# Patient Record
Sex: Female | Born: 1935 | Race: Black or African American | Hispanic: No | Smoking: Former smoker
Health system: Southern US, Community
[De-identification: ages and names within clinical notes are randomized; demographics above are authoritative.]

## PROBLEM LIST (undated history)

## (undated) DIAGNOSIS — R7303 Prediabetes: Secondary | ICD-10-CM

## (undated) DIAGNOSIS — I10 Essential (primary) hypertension: Secondary | ICD-10-CM

## (undated) HISTORY — PX: TUBAL LIGATION: SHX77

## (undated) HISTORY — PX: KNEE SURGERY: SHX244

---

## 2004-07-16 ENCOUNTER — Ambulatory Visit: Payer: Self-pay | Admitting: Internal Medicine

## 2005-10-05 ENCOUNTER — Ambulatory Visit: Payer: Self-pay | Admitting: Internal Medicine

## 2006-10-20 ENCOUNTER — Ambulatory Visit: Payer: Self-pay | Admitting: Internal Medicine

## 2007-10-24 ENCOUNTER — Ambulatory Visit: Payer: Self-pay | Admitting: Internal Medicine

## 2008-11-14 ENCOUNTER — Ambulatory Visit: Payer: Self-pay | Admitting: Internal Medicine

## 2009-11-18 ENCOUNTER — Ambulatory Visit: Payer: Self-pay | Admitting: Internal Medicine

## 2010-12-03 ENCOUNTER — Ambulatory Visit: Payer: Self-pay | Admitting: Internal Medicine

## 2012-01-13 ENCOUNTER — Ambulatory Visit: Payer: Self-pay

## 2012-02-09 IMAGING — MG MM CAD SCREENING MAMMO
1 series · 4 of 4 positions shown · non-contrast
Comparison: 11/18/2009 and 11/14/2008.

REASON FOR EXAM: SCR MAMMO
COMMENTS:

PROCEDURE:     MAM - MAM DGTL SCREENING MAMMO W/CAD  - December 03, 2010 [DATE]
RESULT:

[Series 6713: R CC · right · 4 of 4 slices shown]
[im 1/4]
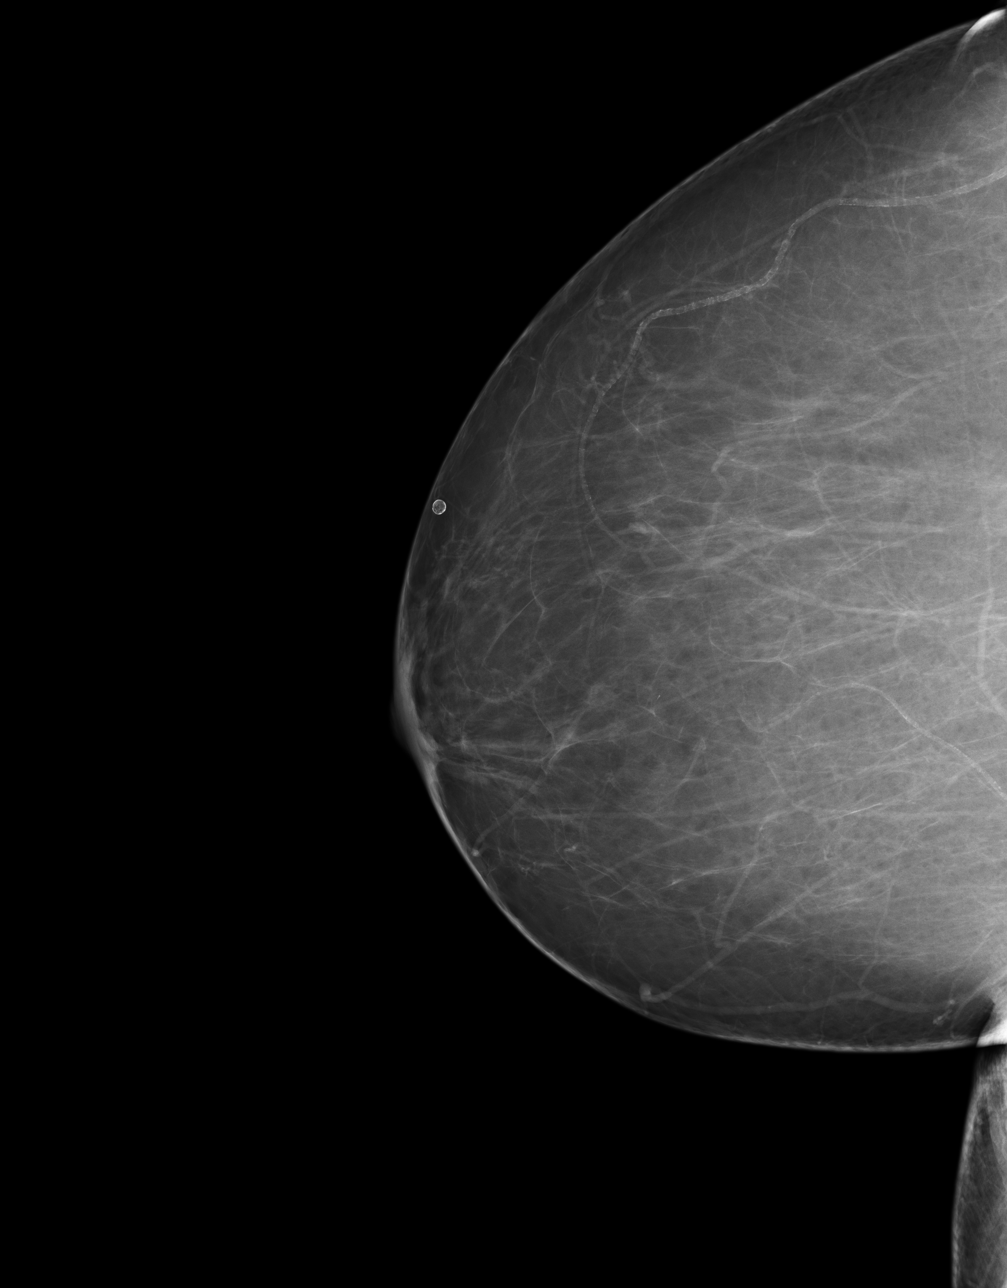
[im 2/4]
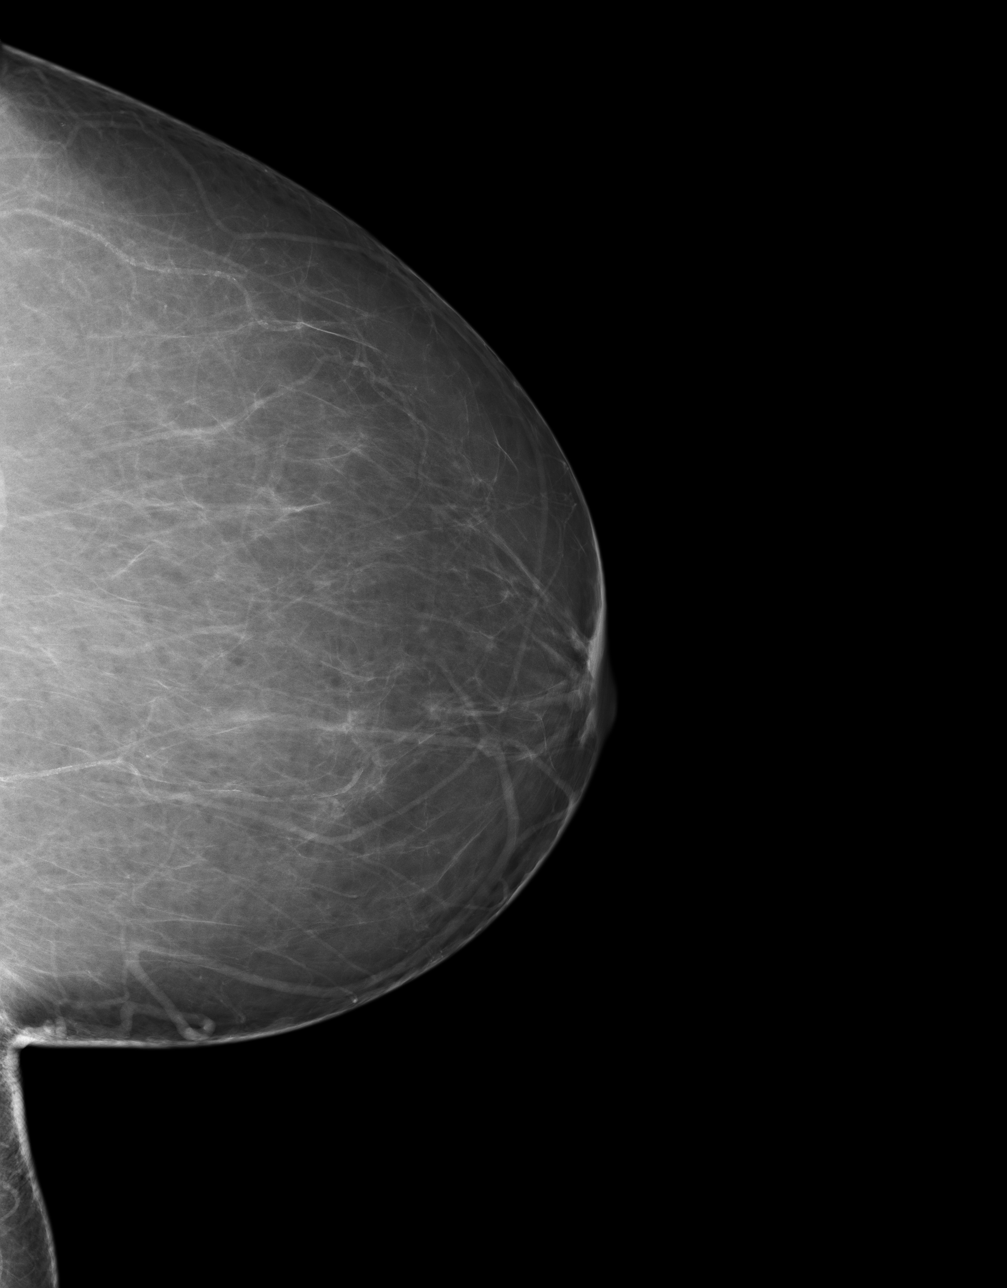
[im 3/4]
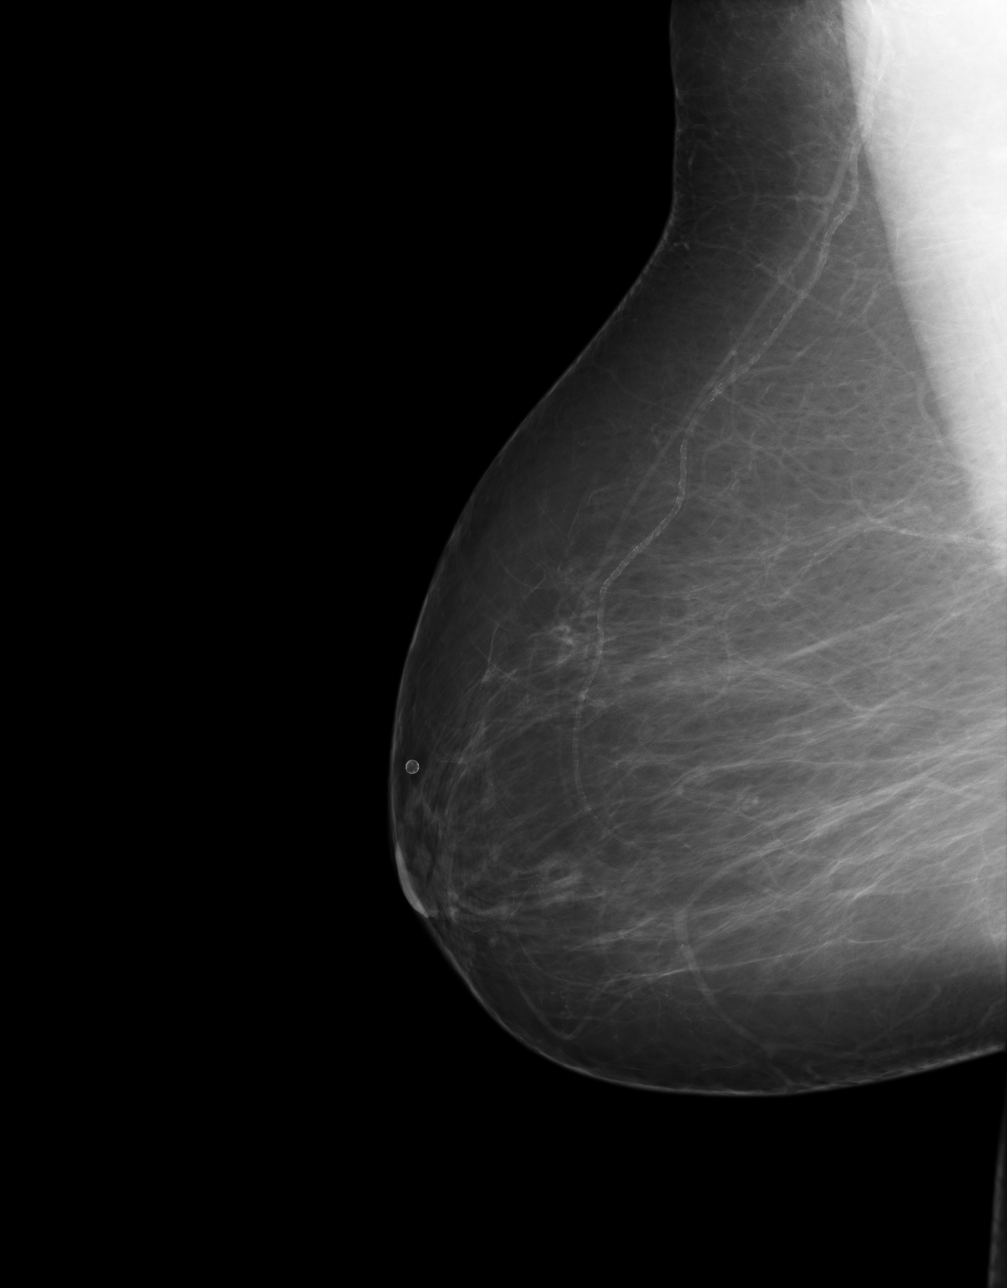
[im 4/4]
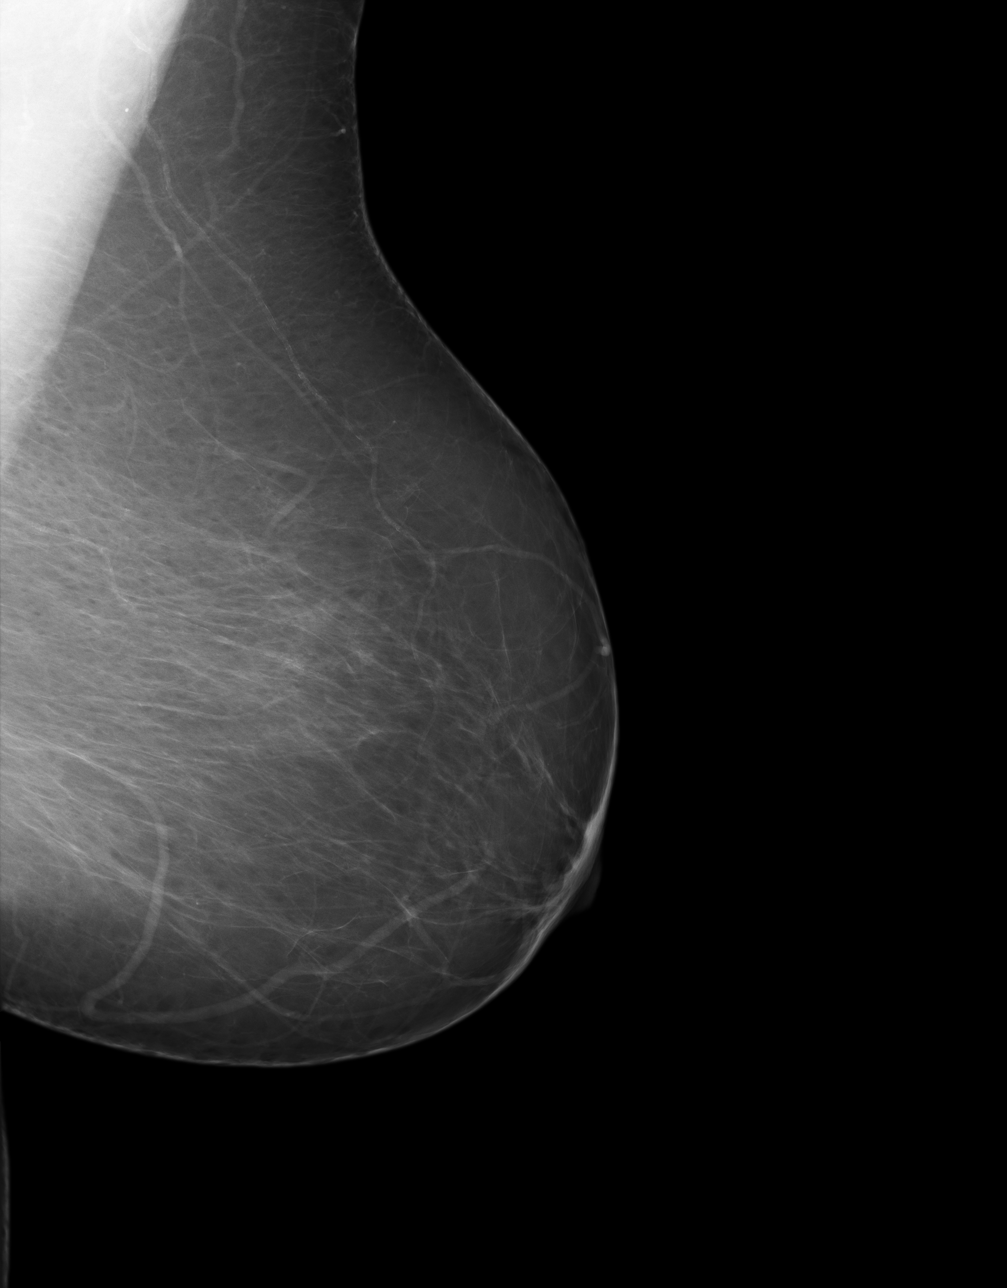

[4 of 4 positions shown; findings below may reference images not displayed]

FINDINGS: The breast tissue is predominantly fatty. No suspicious masses or
calcifications are identified. No areas of architectural distortion.
IMPRESSION: BI-RADS: Category 1 - Negative

RECOMMENDATION:  Continued annual screening mammography.

Thank you for this opportunity to contribute to the care of your patient.

A NEGATIVE MAMMOGRAM REPORT DOES NOT PRECLUDE BIOPSY OR OTHER EVALUATION OF
A CLINICALLY PALPABLE OR OTHERWISE SUSPICIOUS MASS OR LESION. BREAST CANCER
MAY NOT BE DETECTED BY MAMMOGRAPHY IN UP TO 10% OF CASES.

## 2013-01-15 ENCOUNTER — Ambulatory Visit: Payer: Self-pay

## 2014-02-26 ENCOUNTER — Ambulatory Visit: Payer: Self-pay | Admitting: Internal Medicine

## 2014-10-24 ENCOUNTER — Other Ambulatory Visit: Payer: Self-pay | Admitting: Internal Medicine

## 2014-10-24 DIAGNOSIS — Z1239 Encounter for other screening for malignant neoplasm of breast: Secondary | ICD-10-CM

## 2015-03-05 ENCOUNTER — Ambulatory Visit: Payer: Self-pay | Attending: Internal Medicine

## 2015-04-23 DIAGNOSIS — M1711 Unilateral primary osteoarthritis, right knee: Secondary | ICD-10-CM | POA: Insufficient documentation

## 2015-04-23 DIAGNOSIS — I10 Essential (primary) hypertension: Secondary | ICD-10-CM | POA: Insufficient documentation

## 2015-04-23 DIAGNOSIS — E119 Type 2 diabetes mellitus without complications: Secondary | ICD-10-CM | POA: Insufficient documentation

## 2015-04-23 DIAGNOSIS — J302 Other seasonal allergic rhinitis: Secondary | ICD-10-CM | POA: Insufficient documentation

## 2015-05-06 ENCOUNTER — Emergency Department (HOSPITAL_COMMUNITY): Admission: EM | Admit: 2015-05-06 | Discharge: 2015-05-06 | Discharge status: Absent without leave | Payer: Self-pay

## 2016-07-08 DIAGNOSIS — G8929 Other chronic pain: Secondary | ICD-10-CM | POA: Insufficient documentation

## 2016-09-21 DIAGNOSIS — E669 Obesity, unspecified: Secondary | ICD-10-CM | POA: Insufficient documentation

## 2016-09-22 ENCOUNTER — Other Ambulatory Visit: Payer: Self-pay | Admitting: Unknown Physician Specialty

## 2016-09-22 DIAGNOSIS — M1711 Unilateral primary osteoarthritis, right knee: Secondary | ICD-10-CM

## 2016-09-29 ENCOUNTER — Ambulatory Visit
Admission: RE | Admit: 2016-09-29 | Discharge: 2016-09-29 | Disposition: A | Discharge status: Home / Self Care | Payer: Medicare Other | Source: Ambulatory Visit | Attending: Unknown Physician Specialty | Admitting: Unknown Physician Specialty

## 2016-09-29 DIAGNOSIS — X58XXXA Exposure to other specified factors, initial encounter: Secondary | ICD-10-CM | POA: Insufficient documentation

## 2016-09-29 DIAGNOSIS — M25461 Effusion, right knee: Secondary | ICD-10-CM | POA: Insufficient documentation

## 2016-09-29 DIAGNOSIS — M1711 Unilateral primary osteoarthritis, right knee: Secondary | ICD-10-CM | POA: Insufficient documentation

## 2016-09-29 DIAGNOSIS — S83281A Other tear of lateral meniscus, current injury, right knee, initial encounter: Secondary | ICD-10-CM | POA: Insufficient documentation

## 2017-08-19 DIAGNOSIS — I10 Essential (primary) hypertension: Secondary | ICD-10-CM | POA: Insufficient documentation

## 2017-11-22 ENCOUNTER — Other Ambulatory Visit: Payer: Self-pay | Admitting: Internal Medicine

## 2017-11-22 DIAGNOSIS — Z1231 Encounter for screening mammogram for malignant neoplasm of breast: Secondary | ICD-10-CM

## 2017-11-22 DIAGNOSIS — M8589 Other specified disorders of bone density and structure, multiple sites: Secondary | ICD-10-CM | POA: Insufficient documentation

## 2018-02-22 ENCOUNTER — Other Ambulatory Visit: Payer: Self-pay | Admitting: Internal Medicine

## 2018-02-22 DIAGNOSIS — M7989 Other specified soft tissue disorders: Principal | ICD-10-CM

## 2018-02-22 DIAGNOSIS — M79662 Pain in left lower leg: Secondary | ICD-10-CM

## 2018-02-23 ENCOUNTER — Ambulatory Visit
Admission: RE | Admit: 2018-02-23 | Discharge: 2018-02-23 | Disposition: A | Discharge status: Home / Self Care | Payer: Medicare Other | Source: Ambulatory Visit | Attending: Internal Medicine | Admitting: Internal Medicine

## 2018-02-23 DIAGNOSIS — M7989 Other specified soft tissue disorders: Secondary | ICD-10-CM | POA: Insufficient documentation

## 2018-02-23 DIAGNOSIS — M79662 Pain in left lower leg: Secondary | ICD-10-CM | POA: Diagnosis not present

## 2018-07-20 DIAGNOSIS — Z0000 Encounter for general adult medical examination without abnormal findings: Secondary | ICD-10-CM | POA: Insufficient documentation

## 2018-09-12 DIAGNOSIS — Z66 Do not resuscitate: Secondary | ICD-10-CM | POA: Insufficient documentation

## 2019-03-07 DIAGNOSIS — M25661 Stiffness of right knee, not elsewhere classified: Secondary | ICD-10-CM | POA: Insufficient documentation

## 2020-04-20 IMAGING — US US EXTREM LOW VENOUS*L*
1 series · 13 of 24 positions shown · non-contrast
Comparison: None.

CLINICAL DATA: 87-year-old female



[Series 1: us extrem low venous*left* · 0.08mm/px · 13 of 46 slices shown]
[im 1/46]
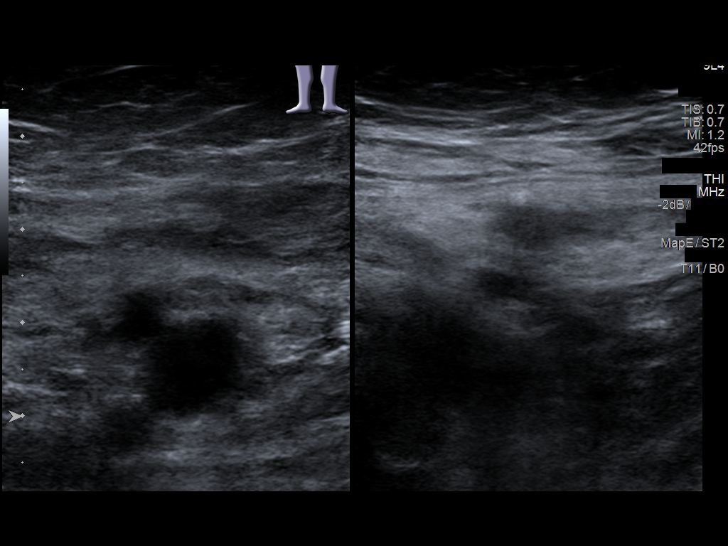
[im 4/46]
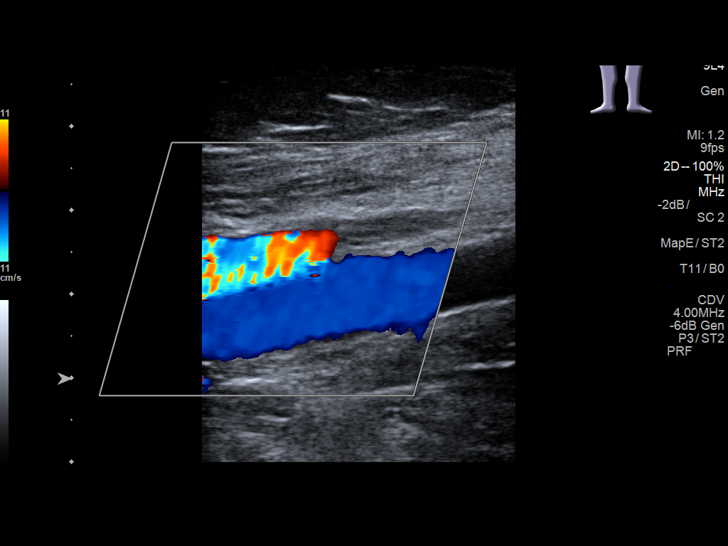
[im 8/46]
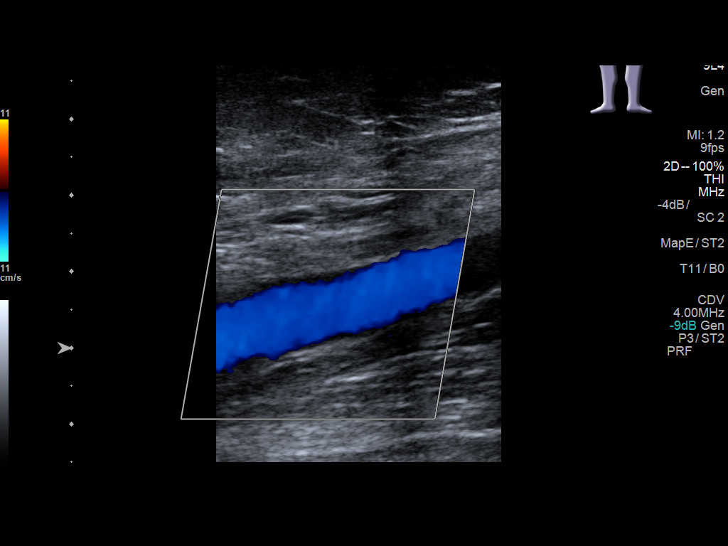
[im 12/46]
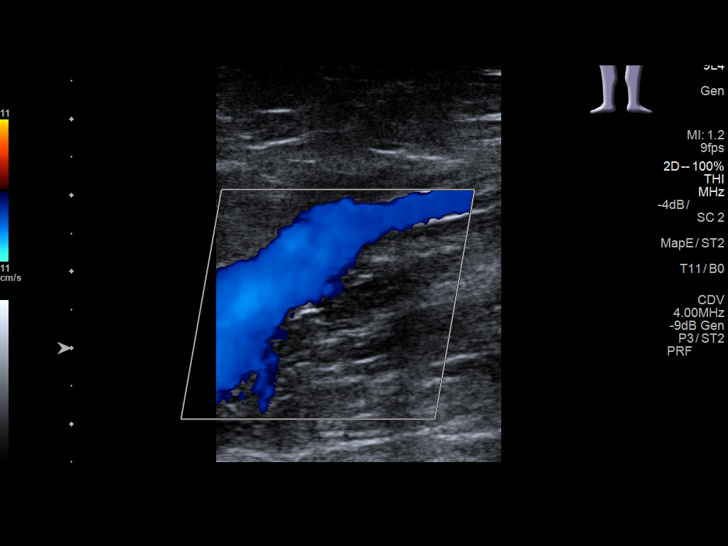
[im 16/46]
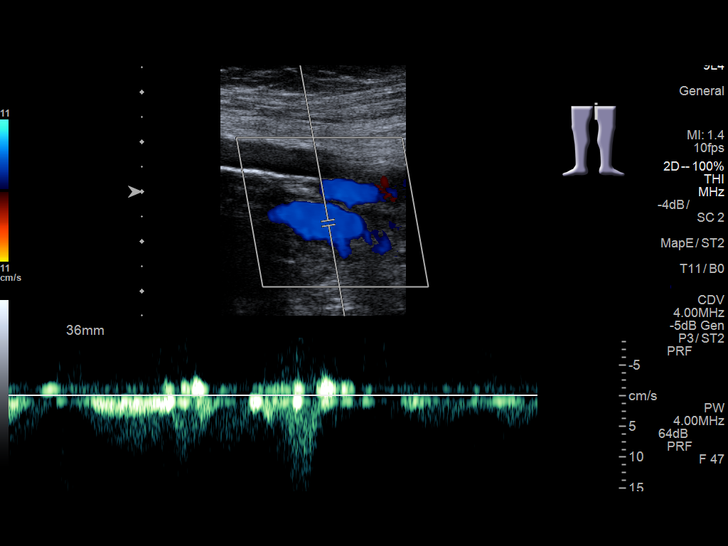
[im 20/46]
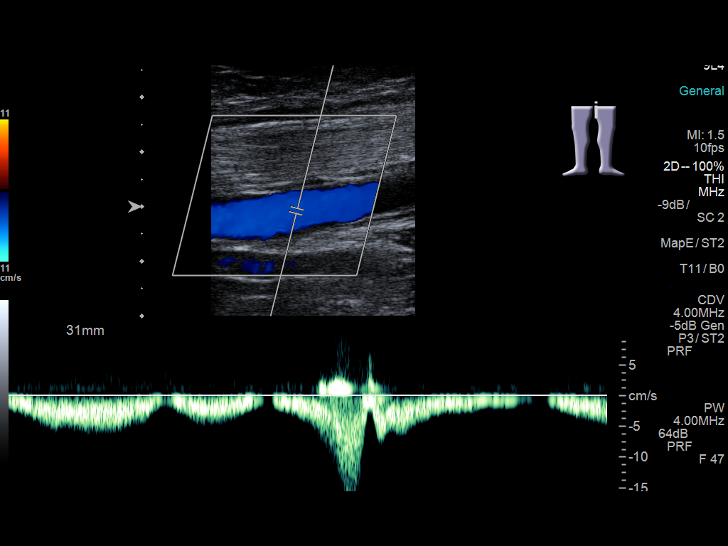
[im 24/46]
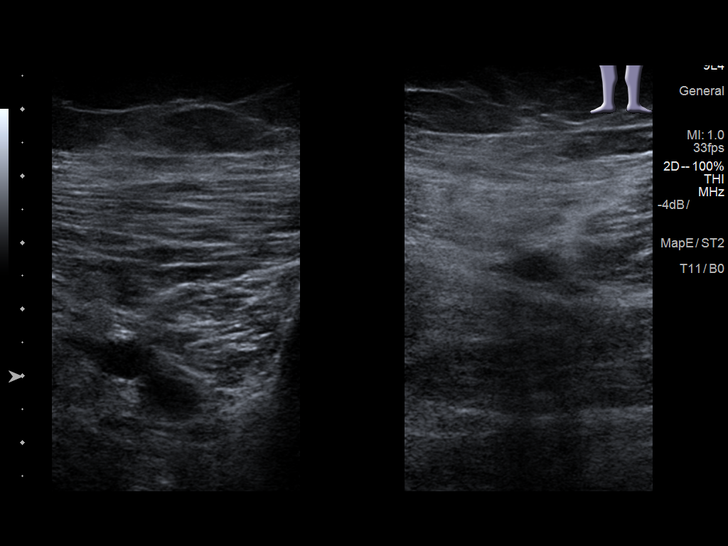
[im 26/46]
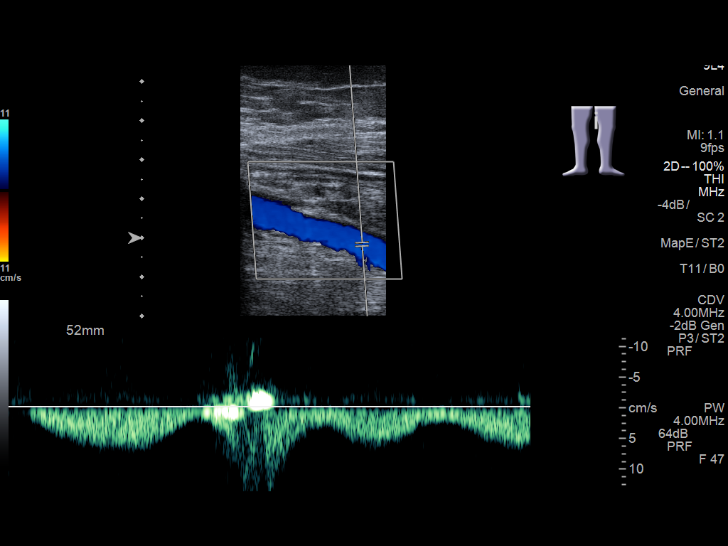
[im 30/46]
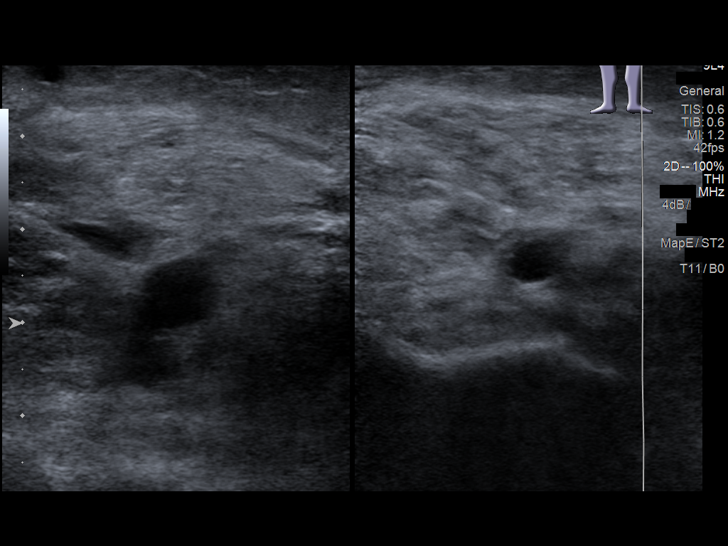
[im 34/46]
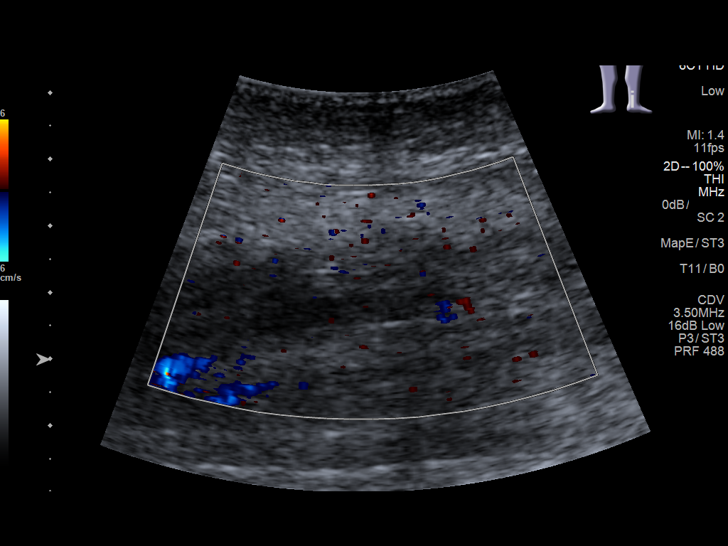
[im 38/46]
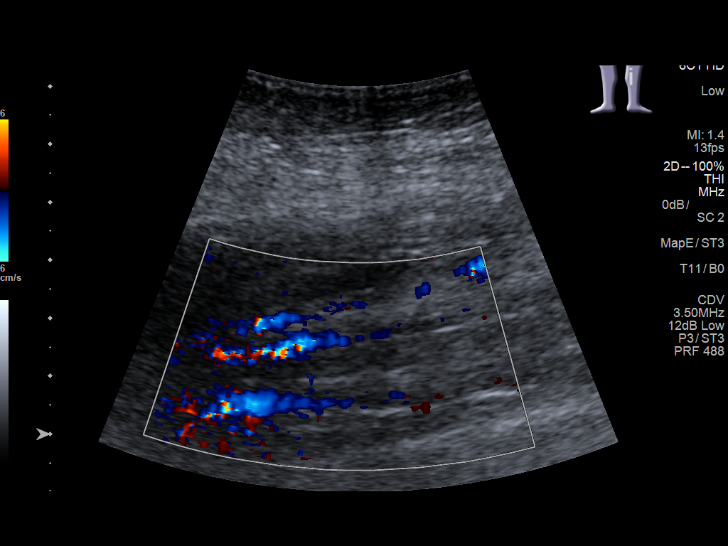
[im 42/46]
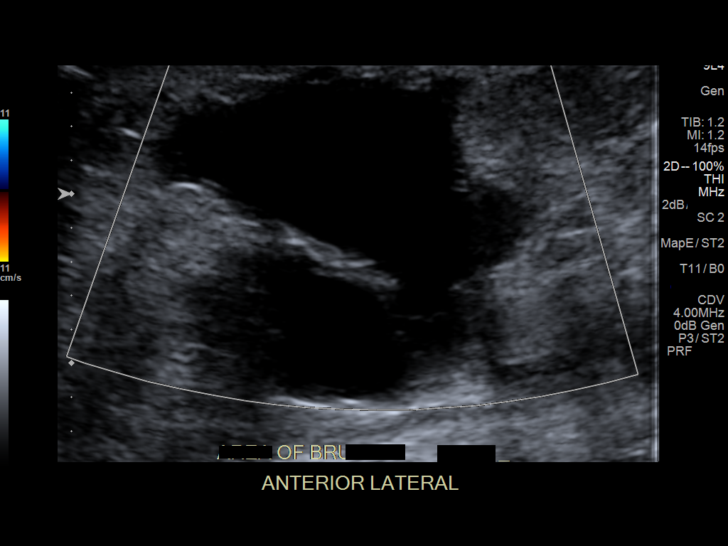
[im 46/46]
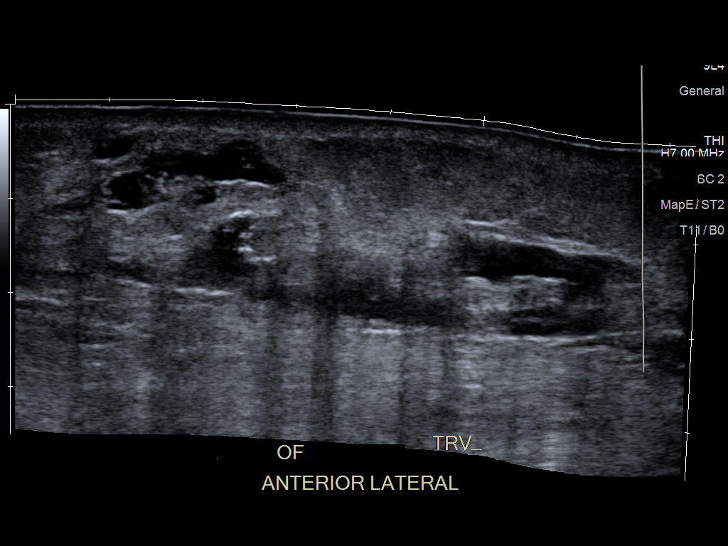

[13 of 24 positions shown; findings below may reference images not displayed]

FINDINGS: Contralateral Common Femoral Vein: Respiratory phasicity is normal
and symmetric with the symptomatic side. No evidence of thrombus.
Normal compressibility.

Common Femoral Vein: No evidence of thrombus. Normal
compressibility, respiratory phasicity and response to augmentation.

Saphenofemoral Junction: No evidence of thrombus. Normal
compressibility and flow on color Doppler imaging.

Profunda Femoral Vein: No evidence of thrombus. Normal
compressibility and flow on color Doppler imaging.

Femoral Vein: No evidence of thrombus. Normal compressibility,
respiratory phasicity and response to augmentation.

Popliteal Vein: No evidence of thrombus. Normal compressibility,
respiratory phasicity and response to augmentation.

Calf Veins: No evidence of thrombus. Normal compressibility and flow
on color Doppler imaging.

Superficial Great Saphenous Vein: No evidence of thrombus. Normal
compressibility.

Venous Reflux:  None.

Other Findings: Hypoechoic to anechoic complex fluid collection in
the superficial subcutaneous tissues deep to the region of bruising.
IMPRESSION: 1. Negative for acute DVT in the left lower extremity.
2. Heterogeneous complex fluid collections in the superficial
subcutaneous soft tissues underlying the region of bruising. If the
patient has a history of recent trauma, the abnormality likely
represents hematoma. In the appropriate clinical setting, abscess
could appear similar.

## 2021-02-12 ENCOUNTER — Encounter: Payer: Self-pay | Admitting: Emergency Medicine

## 2021-02-12 ENCOUNTER — Emergency Department: Payer: Medicare Other

## 2021-02-12 ENCOUNTER — Inpatient Hospital Stay
Admission: EM | Admit: 2021-02-12 | Discharge: 2021-02-14 | DRG: 229 | Disposition: A | Discharge status: Home / Self Care | Payer: Medicare Other | Attending: Hospitalist | Admitting: Hospitalist

## 2021-02-12 ENCOUNTER — Other Ambulatory Visit: Payer: Self-pay

## 2021-02-12 DIAGNOSIS — I495 Sick sinus syndrome: Principal | ICD-10-CM | POA: Diagnosis present

## 2021-02-12 DIAGNOSIS — R001 Bradycardia, unspecified: Secondary | ICD-10-CM | POA: Diagnosis not present

## 2021-02-12 DIAGNOSIS — Z20822 Contact with and (suspected) exposure to covid-19: Secondary | ICD-10-CM | POA: Diagnosis present

## 2021-02-12 DIAGNOSIS — R0602 Shortness of breath: Secondary | ICD-10-CM

## 2021-02-12 DIAGNOSIS — I442 Atrioventricular block, complete: Secondary | ICD-10-CM | POA: Diagnosis not present

## 2021-02-12 DIAGNOSIS — E538 Deficiency of other specified B group vitamins: Secondary | ICD-10-CM | POA: Diagnosis not present

## 2021-02-12 DIAGNOSIS — I10 Essential (primary) hypertension: Secondary | ICD-10-CM | POA: Diagnosis not present

## 2021-02-12 DIAGNOSIS — R7303 Prediabetes: Secondary | ICD-10-CM | POA: Diagnosis not present

## 2021-02-12 DIAGNOSIS — I110 Hypertensive heart disease with heart failure: Secondary | ICD-10-CM | POA: Diagnosis present

## 2021-02-12 DIAGNOSIS — Z79899 Other long term (current) drug therapy: Secondary | ICD-10-CM

## 2021-02-12 DIAGNOSIS — Z6838 Body mass index (BMI) 38.0-38.9, adult: Secondary | ICD-10-CM

## 2021-02-12 DIAGNOSIS — R600 Localized edema: Secondary | ICD-10-CM

## 2021-02-12 DIAGNOSIS — Z87891 Personal history of nicotine dependence: Secondary | ICD-10-CM

## 2021-02-12 DIAGNOSIS — Z7982 Long term (current) use of aspirin: Secondary | ICD-10-CM

## 2021-02-12 DIAGNOSIS — Z8249 Family history of ischemic heart disease and other diseases of the circulatory system: Secondary | ICD-10-CM

## 2021-02-12 HISTORY — DX: Prediabetes: R73.03

## 2021-02-12 HISTORY — DX: Essential (primary) hypertension: I10

## 2021-02-12 LAB — BASIC METABOLIC PANEL
Anion gap: 5 (ref 5–15)
BUN: 26 mg/dL — ABNORMAL HIGH (ref 8–23)
CO2: 27 mmol/L (ref 22–32)
Calcium: 9.5 mg/dL (ref 8.9–10.3)
Chloride: 105 mmol/L (ref 98–111)
Creatinine, Ser: 0.64 mg/dL (ref 0.44–1.00)
GFR, Estimated: >60 mL/min (ref >60)
Glucose, Bld: 108 mg/dL — ABNORMAL HIGH (ref 70–99)
Potassium: 4.7 mmol/L (ref 3.5–5.1)
Sodium: 137 mmol/L (ref 135–145)

## 2021-02-12 LAB — TROPONIN I (HIGH SENSITIVITY)
Troponin I (High Sensitivity): 12 ng/L (ref <18)
Troponin I (High Sensitivity): 10 ng/L (ref <18)

## 2021-02-12 LAB — MAGNESIUM: Magnesium: 2.2 mg/dL (ref 1.7–2.4)

## 2021-02-12 LAB — CBC
HCT: 39.3 % (ref 36.0–46.0)
Hemoglobin: 13.2 g/dL (ref 12.0–15.0)
MCH: 32 pg (ref 26.0–34.0)
MCHC: 33.6 g/dL (ref 30.0–36.0)
MCV: 95.4 fL (ref 80.0–100.0)
Platelets: 210 10*3/uL (ref 150–400)
RBC: 4.12 MIL/uL (ref 3.87–5.11)
RDW: 15.3 % (ref 11.5–15.5)
WBC: 7.2 10*3/uL (ref 4.0–10.5)
nRBC: 0 % (ref 0.0–0.2)

## 2021-02-12 LAB — RESP PANEL BY RT-PCR (FLU A&B, COVID) ARPGX2
Influenza A by PCR: NEGATIVE
Influenza B by PCR: NEGATIVE
SARS Coronavirus 2 by RT PCR: NEGATIVE

## 2021-02-12 LAB — CBG MONITORING, ED: Glucose-Capillary: 119 mg/dL — ABNORMAL HIGH (ref 70–99)

## 2021-02-12 LAB — BRAIN NATRIURETIC PEPTIDE: B Natriuretic Peptide: 162.8 pg/mL — ABNORMAL HIGH (ref 0.0–100.0)

## 2021-02-12 LAB — TSH: TSH: 2.08 u[IU]/mL (ref 0.350–4.500)

## 2021-02-12 MED ORDER — SODIUM CHLORIDE 0.9 % IV SOLN: INTRAVENOUS | Status: DC

## 2021-02-12 MED ORDER — OCUVITE-LUTEIN PO CAPS
1.0000 | ORAL_CAPSULE | Freq: Every day | ORAL | Status: DC
  Administered 2021-02-13 – 2021-02-14 (×2): 1 via ORAL
  Filled 2021-02-12 (×2): qty 1

## 2021-02-12 MED ORDER — CYANOCOBALAMIN 500 MCG PO TABS
500.0000 ug | ORAL_TABLET | Freq: Every day | ORAL | Status: DC
  Administered 2021-02-13 – 2021-02-14 (×2): 500 ug via ORAL
  Filled 2021-02-12 (×2): qty 1

## 2021-02-12 MED ORDER — POLYVINYL ALCOHOL 1.4 % OP SOLN
1.0000 [drp] | Freq: Every day | OPHTHALMIC | Status: DC | PRN
  Filled 2021-02-12: qty 15

## 2021-02-12 MED ORDER — ASPIRIN 81 MG PO CHEW
81.0000 mg | CHEWABLE_TABLET | Freq: Every day | ORAL | Status: DC
  Administered 2021-02-12 – 2021-02-14 (×3): 81 mg via ORAL
  Filled 2021-02-12 (×3): qty 1

## 2021-02-12 MED ORDER — ONDANSETRON HCL 4 MG/2ML IJ SOLN: 4.0000 mg | Freq: Four times a day (QID) | INTRAMUSCULAR | Status: DC | PRN

## 2021-02-12 MED ORDER — ADULT MULTIVITAMIN W/MINERALS CH
1.0000 | ORAL_TABLET | Freq: Every day | ORAL | Status: DC
  Administered 2021-02-13 – 2021-02-14 (×2): 1 via ORAL
  Filled 2021-02-12 (×2): qty 1

## 2021-02-12 MED ORDER — TRAZODONE HCL 50 MG PO TABS: 25.0000 mg | ORAL_TABLET | Freq: Every evening | ORAL | Status: DC | PRN

## 2021-02-12 MED ORDER — ACETAMINOPHEN 650 MG RE SUPP
650.0000 mg | Freq: Four times a day (QID) | RECTAL | Status: DC | PRN
  Filled 2021-02-12: qty 1

## 2021-02-12 MED ORDER — ASCORBIC ACID 500 MG PO TABS
500.0000 mg | ORAL_TABLET | Freq: Every day | ORAL | Status: DC
  Administered 2021-02-13 – 2021-02-14 (×2): 500 mg via ORAL
  Filled 2021-02-12 (×2): qty 1

## 2021-02-12 MED ORDER — OYSTER SHELL CALCIUM/D3 500-5 MG-MCG PO TABS
1.0000 | ORAL_TABLET | Freq: Two times a day (BID) | ORAL | Status: DC
  Administered 2021-02-13 – 2021-02-14 (×3): 1 via ORAL
  Filled 2021-02-12 (×3): qty 1

## 2021-02-12 MED ORDER — CARBOXYMETHYLCELLULOSE SODIUM 1 % OP SOLN: 1.0000 [drp] | Freq: Every day | OPHTHALMIC | Status: DC | PRN

## 2021-02-12 MED ORDER — AMLODIPINE BESYLATE 5 MG PO TABS: 5.0000 mg | ORAL_TABLET | Freq: Every day | ORAL | Status: DC

## 2021-02-12 MED ORDER — ACETAMINOPHEN 325 MG PO TABS: 650.0000 mg | ORAL_TABLET | Freq: Four times a day (QID) | ORAL | Status: DC | PRN

## 2021-02-12 MED ORDER — INSULIN ASPART 100 UNIT/ML IJ SOLN
0.0000 [IU] | Freq: Three times a day (TID) | INTRAMUSCULAR | Status: DC
  Administered 2021-02-13: 1 [IU] via SUBCUTANEOUS
  Filled 2021-02-12: qty 1

## 2021-02-12 MED ORDER — TRIAMTERENE-HCTZ 37.5-25 MG PO TABS
1.0000 | ORAL_TABLET | Freq: Every day | ORAL | Status: DC
  Administered 2021-02-12 – 2021-02-14 (×3): 1 via ORAL
  Filled 2021-02-12 (×3): qty 1

## 2021-02-12 MED ORDER — ENOXAPARIN SODIUM 40 MG/0.4ML IJ SOSY: 40.0000 mg | PREFILLED_SYRINGE | INTRAMUSCULAR | Status: DC

## 2021-02-12 MED ORDER — HYDRALAZINE HCL 20 MG/ML IJ SOLN: 10.0000 mg | Freq: Four times a day (QID) | INTRAMUSCULAR | Status: DC | PRN

## 2021-02-12 MED ORDER — MAGNESIUM HYDROXIDE 400 MG/5ML PO SUSP
30.0000 mL | Freq: Every day | ORAL | Status: DC | PRN
  Administered 2021-02-14: 30 mL via ORAL
  Filled 2021-02-12: qty 30

## 2021-02-12 MED ORDER — LORATADINE 10 MG PO TABS
10.0000 mg | ORAL_TABLET | Freq: Every day | ORAL | Status: DC
  Administered 2021-02-13 – 2021-02-14 (×2): 10 mg via ORAL
  Filled 2021-02-12 (×2): qty 1

## 2021-02-12 MED ORDER — ONDANSETRON HCL 4 MG PO TABS: 4.0000 mg | ORAL_TABLET | Freq: Four times a day (QID) | ORAL | Status: DC | PRN

## 2021-02-12 MED ORDER — LISINOPRIL 20 MG PO TABS
20.0000 mg | ORAL_TABLET | Freq: Every day | ORAL | Status: DC
  Administered 2021-02-12 – 2021-02-14 (×3): 20 mg via ORAL
  Filled 2021-02-12: qty 1
  Filled 2021-02-12 (×2): qty 2

## 2021-02-12 MED ORDER — ENOXAPARIN SODIUM 60 MG/0.6ML IJ SOSY
0.5000 mg/kg | PREFILLED_SYRINGE | INTRAMUSCULAR | Status: DC
  Administered 2021-02-13: 47.5 mg via SUBCUTANEOUS
  Filled 2021-02-12 (×2): qty 0.47

## 2021-02-12 NOTE — ED Notes (Signed)
Meal tray at bedside.  

## 2021-02-12 NOTE — ED Notes (Signed)
Bradler, MD at bedside. 

## 2021-02-12 NOTE — Consult Note (Signed)
Cardiology Consultation:   Patient ID: Monica Harper MRN: [id number]; DOB: January 09, 1931  Admit date: 02/12/2021 Date of Consult: 02/12/2021  PCP:  [facility]   [facility] Providers Cardiologist:   [place]   Patient Profile:   Monica Harper is a 87 y.o. female with a hx of HTN, borderline DM2, Morbid obesity who is being seen 02/12/2021 for the evaluation of bradycardia and dizziness at the request of Dr. Vicente Males.  History of Present Illness:   Monica Harper has not been seen by cardiology in the past. H/o of HTN on lisinopril, amlodipine, and Dyazide. He has diet controlled diabetes. No prior h/o MI, cardiac stenting. CHF, or stroke. She lives with her son and can take care of herself OK. She ambulates well without a cane or walking. There is question as to whether she takes atenolol a home. According to chart review in care everywhere she has not been taking it.   The patient presented to [facility] ED for bradycardia and dizziness from [facility] clinic. She reports 1 week of dizziness, worse when she stood up. Also noted heart rates in the 40s. Noted worsening SOB and LLE as well. No chest pain, nasuea, vomiting, fever, chills, orthopnea, pnd.   EKG showed complete heart block with heart rate of 42bpm. BP 167/67, RR 18, afebrile. Labs show sodium 137, potassium 4.7, Scr 0.64, BUN 108, WBC 7.2, Hgb 13.2. BNP 162. HS trop 12.  CXR unremarkable.    Past Medical History:  Diagnosis Date   Borderline diabetes    Hypertension     Past Surgical History:  Procedure Laterality Date   KNEE SURGERY     TUBAL LIGATION       Home Medications:  Prior to Admission medications   Medication Sig Start Date End Date Taking? Authorizing Provider  amLODipine (NORVASC) 5 MG tablet Take 5 mg by mouth daily. 06/19/19  Yes [provider]  ascorbic acid (VITAMIN C) 500 MG tablet Take 500 mg by mouth daily.   Yes [provider]  aspirin 81 MG chewable tablet Chew 81 mg by mouth daily.  01/22/19  Yes [provider]  atenolol (TENORMIN) 25 MG tablet Take 25 mg by mouth daily. 10/18/17  Yes [provider]  Calcium Carb-Cholecalciferol 600-10 MG-MCG TABS Take 1 tablet by mouth 2 (two) times daily before a meal.   Yes [provider]  Cinnamon Bark POWD 1 packet by Does not apply route daily.   Yes [provider]  fexofenadine (ALLEGRA) 180 MG tablet Take 180 mg by mouth daily.   Yes [provider]  lisinopril (ZESTRIL) 20 MG tablet Take 20 mg by mouth daily. 11/21/20  Yes [provider]  Multiple Vitamin (MULTI-VITAMIN) tablet Take 1 tablet by mouth daily.   Yes [provider]  multivitamin-lutein (OCUVITE-LUTEIN) CAPS capsule Take 1 capsule by mouth daily.   Yes [provider]  triamterene-hydrochlorothiazide (DYAZIDE) 37.5-25 MG capsule Take 1 capsule by mouth daily. 12/05/17  Yes [provider]  vitamin B-12 (CYANOCOBALAMIN) 500 MCG tablet Take 500 mcg by mouth daily.   Yes [provider]  carboxymethylcellulose 1 % ophthalmic solution Apply 1 drop to eye daily as needed.    [provider]    Inpatient Medications: Scheduled Meds:  Continuous Infusions:  PRN Meds:   Allergies:    Allergies  Allergen Reactions   Amoxicillin Nausea And Vomiting    Social History:   Social History   Socioeconomic History   Marital status: Widowed  Spouse name: Not on file   Number of children: Not on file   Years of education: Not on file   Highest education level: Not on file  Occupational History   Not on file  Tobacco Use   Smoking status: Former    Types: Cigarettes   Smokeless tobacco: Never  Substance and Sexual Activity   Alcohol use: Not Currently   Drug use: Not on file   Sexual activity: Not on file  Other Topics Concern   Not on file  Social History Narrative   Not on file   Social Determinants of Health   Financial Resource Strain: Not on file   Food Insecurity: Not on file  Transportation Needs: Not on file  Physical Activity: Not on file  Stress: Not on file  Social Connections: Not on file  Intimate Partner Violence: Not on file    Family History:   History reviewed. No pertinent family history.   ROS:  Please see the history of present illness.   All other ROS reviewed and negative.     Physical Exam/Data:   Vitals:   02/12/21 1225 02/12/21 1300 02/12/21 1330 02/12/21 1400  BP: (!) 167/67 (!) 177/46 (!) 154/49 (!) 157/45  Pulse: (!) 40 (!) 41 (!) 40 (!) 37  Resp: 18 (!) 23 (!) 22 16  Temp: 98.4 F (36.9 C)     TempSrc: Oral     SpO2: 95% 95% 98% 97%  Weight:      Height:       No intake or output data in the 24 hours ending 02/12/21 1403 Last 3 Weights 02/12/2021  Weight (lbs) 211 lb  Weight (kg) 95.709 kg     Body mass index is 38.59 kg/m.  General:  Well nourished, well developed, in no acute distress HEENT: normal Neck: no JVD Vascular: No carotid bruits; Distal pulses 2+ bilaterally Cardiac:  normal S1, S2; bradycardia, RR; no murmur  Lungs:  clear to auscultation bilaterally, no wheezing, rhonchi or rales  Abd: soft, nontender, no hepatomegaly  Ext: mild lower leg edema Musculoskeletal:  No deformities, BUE and BLE strength normal and equal Skin: warm and dry  Neuro:  CNs 2-12 intact, no focal abnormalities noted Psych:  Normal affect   EKG:  The EKG was personally reviewed and demonstrates:  CHB, 41bpm, RAD, nonspecific T wave changes Telemetry:  Telemetry was personally reviewed and demonstrates:  CHB, HR 40s  Relevant CV Studies:   Laboratory Data:  High Sensitivity Troponin:   Recent Labs  Lab 02/12/21 1224  TROPONINIHS 12     Chemistry Recent Labs  Lab 02/12/21 1224  NA 137  K 4.7  CL 105  CO2 27  GLUCOSE 108*  BUN 26*  CREATININE 0.64  CALCIUM 9.5  GFRNONAA >60  ANIONGAP 5    No results for input(s): PROT, ALBUMIN, AST, ALT, ALKPHOS, BILITOT in the last 168  hours. Lipids No results for input(s): CHOL, TRIG, HDL, LABVLDL, LDLCALC, CHOLHDL in the last 168 hours.  Hematology Recent Labs  Lab 02/12/21 1224  WBC 7.2  RBC 4.12  HGB 13.2  HCT 39.3  MCV 95.4  MCH 32.0  MCHC 33.6  RDW 15.3  PLT 210   Thyroid No results for input(s): TSH, FREET4 in the last 168 hours.  BNPNo results for input(s): BNP, PROBNP in the last 168 hours.  DDimer No results for input(s): DDIMER in the last 168 hours.   Radiology/Studies:  [facility] Chest Port 1 View  Result Date:  02/12/2021 CLINICAL DATA:  Shortness of breath and bradycardia. EXAM: PORTABLE CHEST 1 VIEW COMPARISON:  Chest x-ray report dated March 03, 2020. FINDINGS: Normal cardiomediastinal silhouette, accentuated by technique. Normal pulmonary vascularity. Minimal scarring at the lung bases. No focal consolidation, pleural effusion, or pneumothorax. No acute osseous abnormality. IMPRESSION: 1. No active disease. Electronically Signed   By: Obie Dredge M.D.   On: 02/12/2021 13:21     Assessment and Plan:   Complete heart block - presents with dizziness and bradycardia found to be in CHB with hear rate of 42bpm - Does not appear to have been on rate lowering medication as OP - Labs show mildly elevated BNP, normal troponin, otherwise wnl - check Mag and TSH - check echo - Still in CHB, she will need a pacemaker. Will discuss with MD  HTN - BP high - restart home meds  For questions or updates, please contact [facility] Please consult [email or web address] for contact info under    Signed, Chibuike Fleek David Stall, PA-C  02/12/2021 2:03 PM

## 2021-02-12 NOTE — ED Notes (Signed)
 of urine emptied from urine canister.

## 2021-02-12 NOTE — H&P (Addendum)
[place]   PATIENT NAME: Monica Harper    MR#:  [id number]  DATE OF BIRTH:  [birth year]  DATE OF ADMISSION:  02/12/2021  PRIMARY CARE PHYSICIAN: [facility]   Patient is coming from: Home  REQUESTING/REFERRING PHYSICIAN: Phineas Semen, MD CHIEF COMPLAINT:   Chief Complaint  Patient presents with   Bradycardia    HISTORY OF PRESENT ILLNESS:  Monica Harper is a 87 y.o. African-American female with medical history significant for borderline type 2 diabetes mellitus and hypertension, presented to the ER with concern of lightheadedness with associated bradycardia with palpitations for the last week.  Has been worsening with exertion and partially relieved at rest.  No fever or chills.  No chest pain or nausea or vomiting or diaphoresis.  No cough or wheezing or hemoptysis.  No paresthesias or focal muscle weakness.  No dyspnea orthopnea or paroxysmal nocturnal dyspnea or worsening lower extremity edema.  No dysuria, oliguria or hematuria or flank pain.  ED Course: Upon presentation to the ER blood pressure 167/67 with a heart rate of 40 and otherwise normal vital signs.  Labs revealed a BUN of 26 and BNP of 162.8 with otherwise unremarkable BMP and CBC.  High-sensitivity troponin I was 12 and later 10.  Magnesium was 2.2.  Potassium was 4.7.  TSH was 2.08.  Influenza antigens and COVID-19 PCR came back negative. EKG as reviewed by me : EKG showed sinus rhythm with complete heart block and junctional bradycardia with right superior axis deviation and Q waves anteroseptally. Imaging: Chest x-ray showed minimal scarring at the lung bases with no focal consolidation or pleural effusions or osseous abnormalities.  Cardiology consultation was obtained by Dr. Mariah Milling who recommended stopping beta-blocker and 2D echo and will follow with [place].  She was placed on pacer pads.  She later converted to normal sinus rhythm.  The patient will be admitted to an observation cardiac telemetry bed  for further evaluation and management. PAST MEDICAL HISTORY:   Past Medical History:  Diagnosis Date   Borderline diabetes    Hypertension     PAST SURGICAL HISTORY:   Past Surgical History:  Procedure Laterality Date   KNEE SURGERY     TUBAL LIGATION      SOCIAL HISTORY:   Social History   Tobacco Use   Smoking status: Former    Types: Cigarettes   Smokeless tobacco: Never  Substance Use Topics   Alcohol use: Not Currently    FAMILY HISTORY:  Positive for CHF in her mother and sister  DRUG ALLERGIES:   Allergies  Allergen Reactions   Amoxicillin Nausea And Vomiting    REVIEW OF SYSTEMS:   ROS As per history of present illness. All pertinent systems were reviewed above. Constitutional, HEENT, cardiovascular, respiratory, GI, GU, musculoskeletal, neuro, psychiatric, endocrine, integumentary and hematologic systems were reviewed and are otherwise negative/unremarkable except for positive findings mentioned above in the HPI.   MEDICATIONS AT HOME:   Prior to Admission medications   Medication Sig Start Date End Date Taking? Authorizing Provider  amLODipine (NORVASC) 5 MG tablet Take 5 mg by mouth daily. 06/19/19  Yes [provider]  ascorbic acid (VITAMIN C) 500 MG tablet Take 500 mg by mouth daily.   Yes [provider]  aspirin 81 MG chewable tablet Chew 81 mg by mouth daily. 01/22/19  Yes [provider]  atenolol (TENORMIN) 25 MG tablet Take 25 mg by mouth daily. 10/18/17  Yes [provider]  Calcium  Carb-Cholecalciferol 600-10 MG-MCG TABS Take 1 tablet by mouth 2 (two) times daily before a meal.   Yes [provider]  Cinnamon Bark POWD 1 packet by Does not apply route daily.   Yes [provider]  fexofenadine (ALLEGRA) 180 MG tablet Take 180 mg by mouth daily.   Yes [provider]  lisinopril (ZESTRIL) 20 MG tablet Take 20 mg by mouth daily. 11/21/20  Yes [provider]  Multiple  Vitamin (MULTI-VITAMIN) tablet Take 1 tablet by mouth daily.   Yes [provider]  multivitamin-lutein (OCUVITE-LUTEIN) CAPS capsule Take 1 capsule by mouth daily.   Yes [provider]  triamterene-hydrochlorothiazide (DYAZIDE) 37.5-25 MG capsule Take 1 capsule by mouth daily. 12/05/17  Yes [provider]  vitamin B-12 (CYANOCOBALAMIN) 500 MCG tablet Take 500 mcg by mouth daily.   Yes [provider]  carboxymethylcellulose 1 % ophthalmic solution Apply 1 drop to eye daily as needed.    [provider]      VITAL SIGNS:  Blood pressure (!) 175/72, pulse (!) 50, temperature 98.4 F (36.9 C), temperature source Oral, resp. rate 17, height 5\' 2"  (1.575 m), weight 95.7 kg, SpO2 97 %.  PHYSICAL EXAMINATION:  Physical Exam  GENERAL:  87 y.o.-year-old African-American female patient lying in the bed with no acute distress.  EYES: Pupils equal, round, reactive to light and accommodation. No scleral icterus. Extraocular muscles intact.  HEENT: Head atraumatic, normocephalic. Oropharynx and nasopharynx clear.  NECK:  Supple, no jugular venous distention. No thyroid enlargement, no tenderness.  LUNGS: Normal breath sounds bilaterally, no wheezing, rales,rhonchi or crepitation. No use of accessory muscles of respiration.  CARDIOVASCULAR: Bradycardic regular rhythm, S1, S2 normal. No murmurs, rubs, or gallops.  ABDOMEN: Soft, nondistended, nontender. Bowel sounds present. No organomegaly or mass.  EXTREMITIES: No pedal edema, cyanosis, or clubbing.  NEUROLOGIC: Cranial nerves II through XII are intact. Muscle strength 5/5 in all extremities. Sensation intact. Gait not checked.  PSYCHIATRIC: The patient is alert and oriented x 3.  Normal affect and good eye contact. SKIN: No obvious rash, lesion, or ulcer.   LABORATORY PANEL:   CBC Recent Labs  Lab 02/12/21 1224  WBC 7.2  HGB 13.2  HCT 39.3  PLT 210    ------------------------------------------------------------------------------------------------------------------  Chemistries  Recent Labs  Lab 02/12/21 1224 02/12/21 1444  NA 137  --   K 4.7  --   CL 105  --   CO2 27  --   GLUCOSE 108*  --   BUN 26*  --   CREATININE 0.64  --   CALCIUM 9.5  --   MG  --  2.2   ------------------------------------------------------------------------------------------------------------------  Cardiac Enzymes No results for input(s): TROPONINI in the last 168 hours. ------------------------------------------------------------------------------------------------------------------  RADIOLOGY:  DG Chest Port 1 View  Result Date: 02/12/2021 CLINICAL DATA:  Shortness of breath and bradycardia. EXAM: PORTABLE CHEST 1 VIEW COMPARISON:  Chest x-ray report dated March 03, 2020. FINDINGS: Normal cardiomediastinal silhouette, accentuated by technique. Normal pulmonary vascularity. Minimal scarring at the lung bases. No focal consolidation, pleural effusion, or pneumothorax. No acute osseous abnormality. IMPRESSION: 1. No active disease. Electronically Signed   By: Titus Dubin M.D.   On: 02/12/2021 13:21      IMPRESSION AND PLAN:  Principal Problem:   Sinus bradycardia  1.  Symptomatic sinus bradycardia with transient third-degree AV block. - The patient was admitted to a cardiac telemetry bed. - We will follow serial troponins. - Beta-blocker therapy will be stopped.  I will also hold off on amlodipine though it is a dihydropyridine calcium channel blocker that is mainly vasodilator. - 2D echo will be obtained and cardiology follow-up consult. - Dr. Rockey Situ was notified about the patient. - The patient will be continued on pacer past  2.  Essential hypertension currently uncontrolled. - We will hold off Tenormin and Norvasc and resume Zestril and Dyazide. - We will add as needed IV hydralazine.  3.  Vitamin B12 deficiency. - We will  continue vitamin B12.  4.  Borderline type 2 diabetes mellitus. - The patient will be placed on supplement coverage with NovoLog.  DVT prophylaxis: Lovenox. Code Status: full code. Family Communication:  The plan of care was discussed in details with the patient (and family). I answered all questions. The patient agreed to proceed with the above mentioned plan. Further management will depend upon hospital course. Disposition Plan: Back to previous home environment Consults called: Cardiology. All the records are reviewed and case discussed with ED provider.  Status is: Observation   Dispo: The patient is from: Home              Anticipated d/c is to: Home              Patient currently is not medically stable to d/c.              Difficult to place patient: No  TOTAL TIME TAKING CARE OF THIS PATIENT: 55 minutes.     Christel Mormon M.D on 02/12/2021 at 6:32 PM  [facility]   From 7 PM-7 AM, contact night-coverage [email or web address]  CC: Primary care physician; [place]

## 2021-02-12 NOTE — Progress Notes (Signed)
Patient received to ED 34 to await bed assignment. Patient alert and oriented, husband at bedside. NSR on tele. No needs at this time.

## 2021-02-12 NOTE — ED Provider Notes (Signed)
[facility] Emergency Department Provider Note  ____________________________________________   Event Date/Time   First MD Initiated Contact with Patient 02/12/21 1237     (approximate)  I have reviewed the triage vital signs and the nursing notes.   HISTORY  Chief Complaint Bradycardia  HPI Monica Harper is a 87 y.o. female who presents for bradycardia and lightheadedness  LOCATION: Generalized DURATION: 1 week prior to arrival TIMING: Stable since onset SEVERITY: Severe QUALITY: Lightheadedness CONTEXT: Patient states that 1 week prior to arrival she began having increasing lightheadedness and feelings of slow heart rate MODIFYING FACTORS: Worsened with exertion and partially relieved at rest ASSOCIATED SYMPTOMS: Denies   Per medical record review, patient has history of hypertension          Past Medical History:  Diagnosis Date   Borderline diabetes    Hypertension     There are no problems to display for this patient.   Past Surgical History:  Procedure Laterality Date   KNEE SURGERY     TUBAL LIGATION      Prior to Admission medications   Medication Sig Start Date End Date Taking? Authorizing Provider  amLODipine (NORVASC) 5 MG tablet Take 5 mg by mouth daily. 06/19/19  Yes [provider]  ascorbic acid (VITAMIN C) 500 MG tablet Take 500 mg by mouth daily.   Yes [provider]  aspirin 81 MG chewable tablet Chew 81 mg by mouth daily. 01/22/19  Yes [provider]  atenolol (TENORMIN) 25 MG tablet Take 25 mg by mouth daily. 10/18/17  Yes [provider]  Calcium Carb-Cholecalciferol 600-10 MG-MCG TABS Take 1 tablet by mouth 2 (two) times daily before a meal.   Yes [provider]  Cinnamon Bark POWD 1 packet by Does not apply route daily.   Yes [provider]  fexofenadine (ALLEGRA) 180 MG tablet Take 180 mg by mouth daily.   Yes [provider]  lisinopril (ZESTRIL)  20 MG tablet Take 20 mg by mouth daily. 11/21/20  Yes [provider]  Multiple Vitamin (MULTI-VITAMIN) tablet Take 1 tablet by mouth daily.   Yes [provider]  multivitamin-lutein (OCUVITE-LUTEIN) CAPS capsule Take 1 capsule by mouth daily.   Yes [provider]  triamterene-hydrochlorothiazide (DYAZIDE) 37.5-25 MG capsule Take 1 capsule by mouth daily. 12/05/17  Yes [provider]  vitamin B-12 (CYANOCOBALAMIN) 500 MCG tablet Take 500 mcg by mouth daily.   Yes [provider]  carboxymethylcellulose 1 % ophthalmic solution Apply 1 drop to eye daily as needed.    [provider]    Allergies Amoxicillin  History reviewed. No pertinent family history.  Social History Social History   Tobacco Use   Smoking status: Former    Types: Cigarettes   Smokeless tobacco: Never  Substance Use Topics   Alcohol use: Not Currently    Review of Systems Constitutional: No fever/chills Eyes: No visual changes. ENT: No sore throat. Cardiovascular: Denies chest pain. Respiratory: Denies shortness of breath. Gastrointestinal: No abdominal pain.  No nausea, no vomiting.  No diarrhea. Genitourinary: Negative for dysuria. Musculoskeletal: Negative for acute arthralgias Skin: Negative for rash. Neurological: Positive for generalized weakness and lightheadedness.  Negative for headaches, numbness/paresthesias in any extremity Psychiatric: Negative for suicidal ideation/homicidal ideation   ____________________________________________   PHYSICAL EXAM:  VITAL SIGNS: ED Triage Vitals  Enc Vitals Group     BP 02/12/21 1225 (!) 167/67     Pulse Rate 02/12/21 1225 (!) 40  Resp 02/12/21 1225 18     Temp 02/12/21 1225 98.4 F (36.9 C)     Temp Source 02/12/21 1225 Oral     SpO2 02/12/21 1225 95 %     Weight 02/12/21 1221 211 lb (95.7 kg)     Height 02/12/21 1221 5\' 2"  (1.575 m)     Head Circumference --      Peak Flow --      Pain  Score 02/12/21 1221 0     Pain Loc --      Pain Edu? --      Excl. in GC? --    Constitutional: Alert and oriented. Well appearing and in no acute distress. Eyes: Conjunctivae are normal. PERRL. Head: Atraumatic. Nose: No congestion/rhinnorhea. Mouth/Throat: Mucous membranes are moist. Neck: No stridor Cardiovascular: Grossly normal bradycardic heart sounds.  Good peripheral circulation. Respiratory: Normal respiratory effort.  No retractions. Gastrointestinal: Soft and nontender. No distention. Musculoskeletal: No obvious deformities Neurologic:  Normal speech and language. No gross focal neurologic deficits are appreciated. Skin:  Skin is warm and dry. No rash noted. Psychiatric: Mood and affect are normal. Speech and behavior are normal.  ____________________________________________   LABS (all labs ordered are listed, but only abnormal results are displayed)  Labs Reviewed  BASIC METABOLIC PANEL - Abnormal; Notable for the following components:      Result Value   Glucose, Bld 108 (*)    BUN 26 (*)    All other components within normal limits  BRAIN NATRIURETIC PEPTIDE - Abnormal; Notable for the following components:   B Natriuretic Peptide 162.8 (*)    All other components within normal limits  RESP PANEL BY RT-PCR (FLU A&B, COVID) ARPGX2  CBC  TSH  MAGNESIUM  TROPONIN I (HIGH SENSITIVITY)  TROPONIN I (HIGH SENSITIVITY)   ____________________________________________  EKG  ED ECG REPORT I, 14/08/22, the attending physician, personally viewed and interpreted this ECG.  Date: 02/12/2021 EKG Time: 1229 Rate: 42 Rhythm: Complete heart block QRS Axis: normal Intervals: Third-degree AV block ST/T Wave abnormalities: normal Narrative Interpretation: Third-degree AV block.  No evidence of acute ischemia  ____________________________________________  RADIOLOGY  ED MD interpretation: Single view portable chest x-ray shows no evidence of acute abnormalities  including no pneumonia, pneumothorax, or widened mediastinum  Official radiology report(s): DG Chest Port 1 View  Result Date: 02/12/2021 CLINICAL DATA:  Shortness of breath and bradycardia. EXAM: PORTABLE CHEST 1 VIEW COMPARISON:  Chest x-ray report dated March 03, 2020. FINDINGS: Normal cardiomediastinal silhouette, accentuated by technique. Normal pulmonary vascularity. Minimal scarring at the lung bases. No focal consolidation, pleural effusion, or pneumothorax. No acute osseous abnormality. IMPRESSION: 1. No active disease. Electronically Signed   By: March 05, 2020 M.D.   On: 02/12/2021 13:21    ____________________________________________   PROCEDURES  Procedure(s) performed (including Critical Care):  .1-3 Lead EKG Interpretation Performed by: 14/10/2020, MD Authorized by: Merwyn Katos, MD     Interpretation: abnormal     ECG rate:  35   ECG rate assessment: bradycardic     Rhythm: sinus bradycardia     Ectopy: none     Conduction: abnormal     Abnormal conduction: 3rd degree AV block    CRITICAL CARE Performed by: Merwyn Katos   Total critical care time: 35 minutes  Critical care time was exclusive of separately billable procedures and treating other patients.  Critical care was necessary to treat or prevent imminent or life-threatening deterioration.  Critical care was  time spent personally by me on the following activities: development of treatment plan with patient and/or surrogate as well as nursing, discussions with consultants, evaluation of patient's response to treatment, examination of patient, obtaining history from patient or surrogate, ordering and performing treatments and interventions, ordering and review of laboratory studies, ordering and review of radiographic studies, pulse oximetry and re-evaluation of patient's condition.  ____________________________________________   INITIAL IMPRESSION / ASSESSMENT AND PLAN / ED COURSE  As part  of my medical decision making, I reviewed the following data within the electronic medical record, if available:  Nursing notes reviewed and incorporated, Labs reviewed, EKG interpreted, Old chart reviewed, Radiograph reviewed and Notes from prior ED visits reviewed and incorporated      Patient presents with signs and symptoms consistent with third-degree AV block.  Differential diagnosis includes but is not limited to, MI/ACS, PE, medication overdose, electrolyte abnormality  Work-up: CBC shows no signs of abnormalities BMP shows slightly elevated glucose of 108 and BUN of 26 BNP 162  Consult: Spoke to Dr. Mariah Milling in cardiology who agrees accept this patient onto their service for likely pacemaker placement  Dispo: Admit to cardiology      ____________________________________________  FINAL CLINICAL IMPRESSION(S) / ED DIAGNOSES  Final diagnoses:  Complete heart block [facility])     ED Discharge Orders     None        Note:  This document was prepared using Dragon voice recognition software and may include unintentional dictation errors.    Merwyn Katos, MD 02/12/21 [phone]

## 2021-02-12 NOTE — ED Notes (Signed)
Mansy, MD at bedside. 

## 2021-02-12 NOTE — ED Notes (Signed)
Dietary called for meal tray at this time

## 2021-02-12 NOTE — ED Notes (Signed)
Patient repositioned in bed and given warm blankets at this time.

## 2021-02-12 NOTE — ED Triage Notes (Signed)
Pt comes into the ED via POV from [facility] clinic c/o bradycardia and dizziness.  Pt states she has been having a hard time maintaining her BP and she has had significant swelling in bilateral legs.  Pt does have SHOB with exertion.  Pt denies any CHF.  Pt currently has even and unlabored respirations at this time.

## 2021-02-13 ENCOUNTER — Observation Stay (HOSPITAL_COMMUNITY)
Admit: 2021-02-13 | Discharge: 2021-02-13 | Disposition: A | Discharge status: Home / Self Care | Payer: Medicare Other | Attending: Medical | Admitting: Medical

## 2021-02-13 ENCOUNTER — Telehealth: Payer: Self-pay | Admitting: Medical

## 2021-02-13 ENCOUNTER — Encounter: Payer: Self-pay | Admitting: Medical

## 2021-02-13 ENCOUNTER — Encounter
Admission: EM | Disposition: A | Discharge status: Home / Self Care | Payer: Self-pay | Source: Home / Self Care | Attending: Hospitalist

## 2021-02-13 DIAGNOSIS — I442 Atrioventricular block, complete: Secondary | ICD-10-CM

## 2021-02-13 DIAGNOSIS — J810 Acute pulmonary edema: Secondary | ICD-10-CM | POA: Diagnosis not present

## 2021-02-13 DIAGNOSIS — Z20822 Contact with and (suspected) exposure to covid-19: Secondary | ICD-10-CM | POA: Diagnosis present

## 2021-02-13 DIAGNOSIS — R0602 Shortness of breath: Secondary | ICD-10-CM | POA: Diagnosis not present

## 2021-02-13 DIAGNOSIS — R001 Bradycardia, unspecified: Secondary | ICD-10-CM | POA: Diagnosis present

## 2021-02-13 DIAGNOSIS — Z8249 Family history of ischemic heart disease and other diseases of the circulatory system: Secondary | ICD-10-CM | POA: Diagnosis not present

## 2021-02-13 DIAGNOSIS — Z87891 Personal history of nicotine dependence: Secondary | ICD-10-CM | POA: Diagnosis not present

## 2021-02-13 DIAGNOSIS — R600 Localized edema: Secondary | ICD-10-CM | POA: Diagnosis not present

## 2021-02-13 DIAGNOSIS — E538 Deficiency of other specified B group vitamins: Secondary | ICD-10-CM | POA: Diagnosis present

## 2021-02-13 DIAGNOSIS — E119 Type 2 diabetes mellitus without complications: Secondary | ICD-10-CM | POA: Diagnosis present

## 2021-02-13 DIAGNOSIS — Z7982 Long term (current) use of aspirin: Secondary | ICD-10-CM | POA: Diagnosis not present

## 2021-02-13 DIAGNOSIS — I495 Sick sinus syndrome: Secondary | ICD-10-CM | POA: Diagnosis present

## 2021-02-13 DIAGNOSIS — Z79899 Other long term (current) drug therapy: Secondary | ICD-10-CM | POA: Diagnosis not present

## 2021-02-13 DIAGNOSIS — I110 Hypertensive heart disease with heart failure: Secondary | ICD-10-CM | POA: Diagnosis present

## 2021-02-13 HISTORY — PX: PACEMAKER LEADLESS INSERTION: EP1219

## 2021-02-13 LAB — CBC
HCT: 37.4 % (ref 36.0–46.0)
Hemoglobin: 12.1 g/dL (ref 12.0–15.0)
MCH: 30.8 pg (ref 26.0–34.0)
MCHC: 32.4 g/dL (ref 30.0–36.0)
MCV: 95.2 fL (ref 80.0–100.0)
Platelets: 196 10*3/uL (ref 150–400)
RBC: 3.93 MIL/uL (ref 3.87–5.11)
RDW: 15.4 % (ref 11.5–15.5)
WBC: 5.8 10*3/uL (ref 4.0–10.5)
nRBC: 0 % (ref 0.0–0.2)

## 2021-02-13 LAB — GLUCOSE, CAPILLARY
Glucose-Capillary: 96 mg/dL (ref 70–99)
Glucose-Capillary: 89 mg/dL (ref 70–99)

## 2021-02-13 LAB — CBG MONITORING, ED
Glucose-Capillary: 146 mg/dL — ABNORMAL HIGH (ref 70–99)
Glucose-Capillary: 95 mg/dL (ref 70–99)

## 2021-02-13 LAB — BASIC METABOLIC PANEL
Anion gap: 7 (ref 5–15)
BUN: 21 mg/dL (ref 8–23)
CO2: 24 mmol/L (ref 22–32)
Calcium: 8.6 mg/dL — ABNORMAL LOW (ref 8.9–10.3)
Chloride: 107 mmol/L (ref 98–111)
Creatinine, Ser: 0.52 mg/dL (ref 0.44–1.00)
GFR, Estimated: >60 mL/min (ref >60)
Glucose, Bld: 113 mg/dL — ABNORMAL HIGH (ref 70–99)
Potassium: 4.1 mmol/L (ref 3.5–5.1)
Sodium: 138 mmol/L (ref 135–145)

## 2021-02-13 LAB — ECHOCARDIOGRAM COMPLETE
AR max vel: 2.11 cm2
AV Area VTI: 1.92 cm2
AV Area mean vel: 1.97 cm2
AV Mean grad: 5 mmHg
AV Peak grad: 8.8 mmHg
Ao pk vel: 1.48 m/s
Area-P 1/2: 4.71 cm2
Height: 62 in
MV VTI: 2.18 cm2
S' Lateral: 2.9 cm
Weight: 3376 oz

## 2021-02-13 SURGERY — PACEMAKER LEADLESS INSERTION
Anesthesia: Moderate Sedation

## 2021-02-13 MED ORDER — LIDOCAINE HCL (PF) 1 % IJ SOLN
INTRAMUSCULAR | Status: DC | PRN
  Administered 2021-02-13: 50 mL

## 2021-02-13 MED ORDER — SODIUM CHLORIDE 0.9% FLUSH: 3.0000 mL | INTRAVENOUS | Status: DC | PRN

## 2021-02-13 MED ORDER — LIDOCAINE HCL 1 % IJ SOLN
INTRAMUSCULAR | Status: AC
  Filled 2021-02-13: qty 20

## 2021-02-13 MED ORDER — HYDRALAZINE HCL 25 MG PO TABS
25.0000 mg | ORAL_TABLET | Freq: Two times a day (BID) | ORAL | Status: DC
  Administered 2021-02-13 – 2021-02-14 (×3): 25 mg via ORAL
  Filled 2021-02-13 (×3): qty 1

## 2021-02-13 MED ORDER — SODIUM CHLORIDE 0.45 % IV SOLN
INTRAVENOUS | Status: DC
  Filled 2021-02-13: qty 1000

## 2021-02-13 MED ORDER — MIDAZOLAM HCL 2 MG/2ML IJ SOLN
INTRAMUSCULAR | Status: AC
  Filled 2021-02-13: qty 2

## 2021-02-13 MED ORDER — HEPARIN (PORCINE) IN NACL 1000-0.9 UT/500ML-% IV SOLN
INTRAVENOUS | Status: AC
  Filled 2021-02-13: qty 1000

## 2021-02-13 MED ORDER — ONDANSETRON HCL 4 MG/2ML IJ SOLN: 4.0000 mg | Freq: Four times a day (QID) | INTRAMUSCULAR | Status: DC | PRN

## 2021-02-13 MED ORDER — FENTANYL CITRATE (PF) 100 MCG/2ML IJ SOLN
INTRAMUSCULAR | Status: DC | PRN
  Administered 2021-02-13: 25 ug via INTRAVENOUS
  Administered 2021-02-13: 50 ug via INTRAVENOUS

## 2021-02-13 MED ORDER — HEPARIN (PORCINE) IN NACL 1000-0.9 UT/500ML-% IV SOLN
INTRAVENOUS | Status: DC | PRN
  Administered 2021-02-13: 1000 mL

## 2021-02-13 MED ORDER — VANCOMYCIN HCL IN DEXTROSE 1-5 GM/200ML-% IV SOLN
1000.0000 mg | INTRAVENOUS | Status: DC
  Filled 2021-02-13: qty 200

## 2021-02-13 MED ORDER — ACETAMINOPHEN 325 MG PO TABS: 650.0000 mg | ORAL_TABLET | ORAL | Status: DC | PRN

## 2021-02-13 MED ORDER — IOHEXOL 300 MG/ML  SOLN
INTRAMUSCULAR | Status: DC | PRN
  Administered 2021-02-13: 9 mL

## 2021-02-13 MED ORDER — FENTANYL CITRATE (PF) 100 MCG/2ML IJ SOLN
INTRAMUSCULAR | Status: AC
  Filled 2021-02-13: qty 2

## 2021-02-13 MED ORDER — AMLODIPINE BESYLATE 5 MG PO TABS
5.0000 mg | ORAL_TABLET | Freq: Every day | ORAL | Status: DC
  Administered 2021-02-13 – 2021-02-14 (×2): 5 mg via ORAL
  Filled 2021-02-13 (×2): qty 1

## 2021-02-13 MED ORDER — MIDAZOLAM HCL 2 MG/2ML IJ SOLN
INTRAMUSCULAR | Status: DC | PRN
  Administered 2021-02-13: 1 mg via INTRAVENOUS

## 2021-02-13 MED ORDER — SODIUM CHLORIDE 0.9 % IV SOLN
INTRAVENOUS | Status: DC
  Filled 2021-02-13: qty 1000

## 2021-02-13 MED ORDER — SODIUM CHLORIDE 0.9% FLUSH
3.0000 mL | Freq: Two times a day (BID) | INTRAVENOUS | Status: DC
  Administered 2021-02-13 – 2021-02-14 (×2): 3 mL via INTRAVENOUS

## 2021-02-13 MED ORDER — HEPARIN SODIUM (PORCINE) 1000 UNIT/ML IJ SOLN
INTRAMUSCULAR | Status: AC
  Filled 2021-02-13: qty 10

## 2021-02-13 MED ORDER — SODIUM CHLORIDE 0.9 % IV SOLN: 250.0000 mL | INTRAVENOUS | Status: DC | PRN

## 2021-02-13 MED ORDER — HEPARIN SODIUM (PORCINE) 1000 UNIT/ML IJ SOLN
INTRAMUSCULAR | Status: DC | PRN
  Administered 2021-02-13: 5000 [IU] via INTRAVENOUS

## 2021-02-13 SURGICAL SUPPLY — 21 items
CABLE ADAPT PACING TEMP 12FT (ADAPTER) ×2 IMPLANT
DILATOR VESSEL 38 20CM 12FR (INTRODUCER) ×2 IMPLANT
DILATOR VESSEL 38 20CM 14FR (INTRODUCER) ×2 IMPLANT
DILATOR VESSEL 38 20CM 18FR (INTRODUCER) ×2 IMPLANT
DILATOR VESSEL 38 20CM 8FR (INTRODUCER) ×2 IMPLANT
DRAPE BRACHIAL (DRAPES) ×2 IMPLANT
MICRA AV TRANSCATH PACING SYS (Pacemaker) ×2 IMPLANT
MICRA INTRODUCER SHEATH (SHEATH) ×2
NEEDLE PERC 18GX7CM (NEEDLE) ×2 IMPLANT
PACK CARDIAC CATH (CUSTOM PROCEDURE TRAY) ×2 IMPLANT
PAD ELECT DEFIB RADIOL ZOLL (MISCELLANEOUS) ×2 IMPLANT
PANNUS RETENTION SYSTEM 2 PAD (MISCELLANEOUS) ×2 IMPLANT
PROTECTION STATION PRESSURIZED (MISCELLANEOUS) ×2
SHEATH AVANTI 6FR X 11CM (SHEATH) ×2 IMPLANT
SHEATH AVANTI 7FRX11 (SHEATH) ×2 IMPLANT
SHEATH INTRODUCER MICRA (SHEATH) ×1 IMPLANT
STATION PROTECTION PRESSURIZED (MISCELLANEOUS) ×1 IMPLANT
SUT SILK 0 FSL (SUTURE) ×2 IMPLANT
SYSTEM PACING TRNSCTH AV MICRA (Pacemaker) ×1 IMPLANT
WIRE AMPLATZ SS-J .035X180CM (WIRE) ×2 IMPLANT
WIRE PACING TEMP ST TIP 5 (CATHETERS) ×2 IMPLANT

## 2021-02-13 NOTE — Telephone Encounter (Signed)
   Cardiac Monitor Alert  Date of alert:  02/13/2021   Patient Name: Monica Harper  DOB: 03/07/1931  MRN: [id number]   [facility] Cardiologist: None (Dr. Mariah Milling)  [facility] EP:  None    Monitor Information: Long Term Monitor-Live Telemetry [ZioAT]  Reason:  AV block Ordering provider:  Cadence Furth, PA-C   Alert Complete Heart Block  Two episodes: 1122 and 1126 today. HR 41-42.  This is the 1st alert for this rhythm.   Pt is currently admitted for bradycardia and AV block.  Cadence Furth and Dr. Mariah Milling made aware of alert.  Rhythm scanned to chart.   Plan:  Pt currently admitted. Consult for pacemaker.    Other: Zio AT transmission alert scanned to chart.   Lanny Hurst, RN  02/13/2021 1:34 PM

## 2021-02-13 NOTE — Progress Notes (Signed)
Patient sleeping on stretcher. Family at bedside. SB in mid to high 50's on monitor. NAD noted.

## 2021-02-13 NOTE — Progress Notes (Addendum)
Patient with symptomatic, intermittent complete heart block with heart rates the upper 30s and low 40s with shortness of breath.  I had lengthy discussion with patient and patient's family members who agreed to proceed with Micra AV leadless pacemaker.  Discussed the risk, benefits alternatives of Micra AV leadless pacemaker, and informed consent was obtained.

## 2021-02-13 NOTE — Plan of Care (Signed)

## 2021-02-13 NOTE — Progress Notes (Signed)
PROGRESS NOTE    Monica Harper  GNF:[id number] DOB: January 19, 1931 DOA: 02/12/2021 PCP: [facility]  [id number]   Assessment & Plan:   Principal Problem:   Sinus bradycardia Active Problems:   Complete heart block (HCC)   Monica Harper is a 86 y.o. African-American female with medical history significant for borderline type 2 diabetes mellitus and hypertension, presented to the ER with concern of lightheadedness with associated bradycardia with palpitations for the last week.  Has been worsening with exertion and partially relieved at rest.  No fever or chills.    1.  Symptomatic sinus bradycardia with intermittent third-degree AV block. --HR in low 40's with DOE and fatigue. --cardiology consulted --Micra AV leadless pacemaker placement today  2.  Essential hypertension currently uncontrolled. --continue lisinopril, amlodipine, triamterene HCTZ --add hydralazine 25 mg BID, per cardio rec  3.  Vitamin B12 deficiency. - cont vit B12  4.  Borderline type 2 diabetes mellitus. --BG has been within goal --d/c BG checks and SSI    DVT prophylaxis: Lovenox SQ Code Status: Full code  Family Communication: daughters updated at bedside today  Level of care: Telemetry Cardiac Dispo:   The patient is from: home Anticipated d/c is to: home Anticipated d/c date is: likely tomorrow Patient currently is not medically ready to d/c due to: pacemaker placement today   Subjective and Interval History:  Pt reported having DOE and feeling tired.  Leg swelling improved.  Pt went into complete heart block again, so was taken for placement of pacemaker today.   Objective: Vitals:   02/13/21 1630 02/13/21 1645 02/13/21 1713 02/13/21 2012  BP: (!) 146/63 (!) 142/64 (!) 157/56 (!) 145/65  Pulse: 62 63 66 71  Resp: (!) Temp:   98.2 F (36.8 C) (!) 97.5 F (36.4 C)  TempSrc:    Oral  SpO2: 94% 94% 96% 96%  Weight:      Height:        Intake/Output Summary (Last  24 hours) at 02/13/2021 2242 Last data filed at 02/13/2021 1955 Gross per 24 hour  Intake 240 ml  Output 3000 ml  Net -2760 ml   Filed Weights   02/12/21 1221 02/13/21 1410  Weight: 95.7 kg 95.7 kg    Examination:   Constitutional: NAD, AAOx3 HEENT: conjunctivae and lids normal, EOMI CV: No cyanosis.   RESP: normal respiratory effort, on RA Extremities: No effusions, edema in BLE SKIN: warm, dry Neuro: II - XII grossly intact.   Psych: Normal mood and affect.  Appropriate judgement and reason   Data Reviewed: I have personally reviewed following labs and imaging studies  CBC: Recent Labs  Lab 02/12/21 1224 02/13/21 0619  WBC 7.2 5.8  HGB 13.2 12.1  HCT 39.3 37.4  MCV 95.4 95.2  PLT 210 196   Basic Metabolic Panel: Recent Labs  Lab 02/12/21 1224 02/12/21 1444 02/13/21 0619  NA 137  --  138  K 4.7  --  4.1  CL 105  --  107  CO2 27  --  24  GLUCOSE 108*  --  113*  BUN 26*  --  21  CREATININE 0.64  --  0.52  CALCIUM 9.5  --  8.6*  MG  --  2.2  --    GFR: Estimated Creatinine Clearance: 52.4 mL/min (by C-G formula based on SCr of 0.52 mg/dL). Liver Function Tests: No results for input(s): AST, ALT, ALKPHOS, BILITOT, PROT, ALBUMIN in the last 168 hours. No results  for input(s): LIPASE, AMYLASE in the last 168 hours. No results for input(s): AMMONIA in the last 168 hours. Coagulation Profile: No results for input(s): INR, PROTIME in the last 168 hours. Cardiac Enzymes: No results for input(s): CKTOTAL, CKMB, CKMBINDEX, TROPONINI in the last 168 hours. BNP (last 3 results) No results for input(s): PROBNP in the last 8760 hours. HbA1C: No results for input(s): HGBA1C in the last 72 hours. CBG: Recent Labs  Lab 02/12/21 2228 02/13/21 0950 02/13/21 1128 02/13/21 1419 02/13/21 1711  GLUCAP 119* 146* 95 96 89   Lipid Profile: No results for input(s): CHOL, HDL, LDLCALC, TRIG, CHOLHDL, LDLDIRECT in the last 72 hours. Thyroid Function Tests: Recent Labs     02/12/21 1444  TSH 2.080   Anemia Panel: No results for input(s): VITAMINB12, FOLATE, FERRITIN, TIBC, IRON, RETICCTPCT in the last 72 hours. Sepsis Labs: No results for input(s): PROCALCITON, LATICACIDVEN in the last 168 hours.  Recent Results (from the past 240 hour(s))  Resp Panel by RT-PCR (Flu A&B, Covid) Nasopharyngeal Swab     Status: None   Collection Time: 02/12/21  1:44 PM   Specimen: Nasopharyngeal Swab; Nasopharyngeal(NP) swabs in vial transport medium  Result Value Ref Range Status   SARS Coronavirus 2 by RT PCR NEGATIVE NEGATIVE Final    Comment: (NOTE) SARS-CoV-2 target nucleic acids are NOT DETECTED.  The SARS-CoV-2 RNA is generally detectable in upper respiratory specimens during the acute phase of infection. The lowest concentration of SARS-CoV-2 viral copies this assay can detect is 138 copies/mL. A negative result does not preclude SARS-Cov-2 infection and should not be used as the sole basis for treatment or other patient management decisions. A negative result may occur with  improper specimen collection/handling, submission of specimen other than nasopharyngeal swab, presence of viral mutation(s) within the areas targeted by this assay, and inadequate number of viral copies(<138 copies/mL). A negative result must be combined with clinical observations, patient history, and epidemiological information. The expected result is Negative.  Fact Sheet for Patients:  BloggerCourse.com  Fact Sheet for Healthcare Providers:  SeriousBroker.it  This test is no t yet approved or cleared by the [place] FDA and  has been authorized for detection and/or diagnosis of SARS-CoV-2 by FDA under an Emergency Use Authorization (EUA). This EUA will remain  in effect (meaning this test can be used) for the duration of the COVID-19 declaration under Section 564(b)(1) of the Act, 21 U.S.C.section 360bbb-3(b)(1), unless  the authorization is terminated  or revoked sooner.       Influenza A by PCR NEGATIVE NEGATIVE Final   Influenza B by PCR NEGATIVE NEGATIVE Final    Comment: (NOTE) The Xpert Xpress SARS-CoV-2/FLU/RSV plus assay is intended as an aid in the diagnosis of influenza from Nasopharyngeal swab specimens and should not be used as a sole basis for treatment. Nasal washings and aspirates are unacceptable for Xpert Xpress SARS-CoV-2/FLU/RSV testing.  Fact Sheet for Patients: BloggerCourse.com  Fact Sheet for Healthcare Providers: SeriousBroker.it  This test is not yet approved or cleared by the [place] FDA and has been authorized for detection and/or diagnosis of SARS-CoV-2 by FDA under an Emergency Use Authorization (EUA). This EUA will remain in effect (meaning this test can be used) for the duration of the COVID-19 declaration under Section 564(b)(1) of the Act, 21 U.S.C. section 360bbb-3(b)(1), unless the authorization is terminated or revoked.  Performed at [facility], [address], [place], [place] [address]       Radiology Studies: EP  PPM/ICD IMPLANT  Result Date: 02/13/2021 Successful implantation Micra AV leadless pacemaker   DG Chest Port 1 View  Result Date: 02/12/2021 CLINICAL DATA:  Shortness of breath and bradycardia. EXAM: PORTABLE CHEST 1 VIEW COMPARISON:  Chest x-ray report dated March 03, 2020. FINDINGS: Normal cardiomediastinal silhouette, accentuated by technique. Normal pulmonary vascularity. Minimal scarring at the lung bases. No focal consolidation, pleural effusion, or pneumothorax. No acute osseous abnormality. IMPRESSION: 1. No active disease. Electronically Signed   By: Obie Dredge M.D.   On: 02/12/2021 13:21   ECHOCARDIOGRAM COMPLETE  Result Date: 02/13/2021    ECHOCARDIOGRAM REPORT   Patient Name:   LUARA FAYE Date of Exam: 02/13/2021 Medical Rec #:  [id number]   Height:        62.0 in Accession #:    [id number]  Weight:       211.0 lb Date of Birth:  December 14, 1930  BSA:          1.956 m Patient Age:    89 years    BP:           160/46 mmHg Patient Gender: F           HR:           61 bpm. Exam Location:  [facility] Procedure: 2D Echo, Color Doppler and Cardiac Doppler Indications:     I44.2 Heart Block, complete  History:         Patient has no prior history of Echocardiogram examinations.                  Risk Factors:Hypertension.  Sonographer:     Humphrey Rolls Referring Phys:  [id number] CADENCE H FURTH Diagnosing Phys: Julien Nordmann MD IMPRESSIONS  1. Left ventricular ejection fraction, by estimation, is 50 to 55%. The left ventricle has low normal function. The left ventricle demonstrates global hypokinesis. There is mild left ventricular hypertrophy. Left ventricular diastolic parameters are indeterminate.  2. Right ventricular systolic function is normal. The right ventricular size is normal. Tricuspid regurgitation signal is inadequate for assessing PA pressure.  3. The mitral valve is normal in structure. No evidence of mitral valve regurgitation. No evidence of mitral stenosis.  4. The aortic valve is normal in structure. Aortic valve regurgitation is not visualized. Aortic valve sclerosis is present, with no evidence of aortic valve stenosis.  5. The inferior vena cava is normal in size with greater than 50% respiratory variability, suggesting right atrial pressure of 3 mmHg. FINDINGS  Left Ventricle: Left ventricular ejection fraction, by estimation, is 50 to 55%. The left ventricle has low normal function. The left ventricle demonstrates global hypokinesis. The left ventricular internal cavity size was normal in size. There is mild left ventricular hypertrophy. Left ventricular diastolic parameters are indeterminate. Right Ventricle: The right ventricular size is normal. No increase in right ventricular wall thickness. Right ventricular systolic function is normal. Tricuspid  regurgitation signal is inadequate for assessing PA pressure. Left Atrium: Left atrial size was normal in size. Right Atrium: Right atrial size was normal in size. Pericardium: There is no evidence of pericardial effusion. Mitral Valve: The mitral valve is normal in structure. No evidence of mitral valve regurgitation. No evidence of mitral valve stenosis. MV peak gradient, 6.7 mmHg. The mean mitral valve gradient is 2.0 mmHg. Tricuspid Valve: The tricuspid valve is normal in structure. Tricuspid valve regurgitation is not demonstrated. No evidence of tricuspid stenosis. Aortic Valve: The aortic valve is normal in structure. Aortic valve regurgitation is not visualized.  Aortic valve sclerosis is present, with no evidence of aortic valve stenosis. Aortic valve mean gradient measures 5.0 mmHg. Aortic valve peak gradient measures 8.8 mmHg. Aortic valve area, by VTI measures 1.92 cm. Pulmonic Valve: The pulmonic valve was normal in structure. Pulmonic valve regurgitation is not visualized. No evidence of pulmonic stenosis. Aorta: The aortic root is normal in size and structure. Venous: The inferior vena cava is normal in size with greater than 50% respiratory variability, suggesting right atrial pressure of 3 mmHg. IAS/Shunts: No atrial level shunt detected by color flow Doppler.  LEFT VENTRICLE PLAX 2D LVIDd:         4.10 cm   Diastology LVIDs:         2.90 cm   LV e' medial:    7.94 cm/s LV PW:         1.10 cm   LV E/e' medial:  10.2 LV IVS:        1.10 cm   LV e' lateral:   5.33 cm/s LVOT diam:     2.20 cm   LV E/e' lateral: 15.1 LV SV:         65 LV SV Index:   33 LVOT Area:     3.80 cm  RIGHT VENTRICLE RV Basal diam:  3.20 cm LEFT ATRIUM             Index        RIGHT ATRIUM           Index LA diam:        3.60 cm 1.84 cm/m   RA Area:     13.30 cm LA Vol (A2C):   54.0 ml 27.61 ml/m  RA Volume:   31.60 ml  16.16 ml/m LA Vol (A4C):   53.3 ml 27.25 ml/m LA Biplane Vol: 53.6 ml 27.41 ml/m  AORTIC VALVE                      PULMONIC VALVE AV Area (Vmax):    2.11 cm      PV Vmax:       0.63 m/s AV Area (Vmean):   1.97 cm      PV Vmean:      41.100 cm/s AV Area (VTI):     1.92 cm      PV VTI:        0.118 m AV Vmax:           148.00 cm/s   PV Peak grad:  1.6 mmHg AV Vmean:          107.000 cm/s  PV Mean grad:  1.0 mmHg AV VTI:            0.341 m AV Peak Grad:      8.8 mmHg AV Mean Grad:      5.0 mmHg LVOT Vmax:         82.20 cm/s LVOT Vmean:        55.400 cm/s LVOT VTI:          0.172 m LVOT/AV VTI ratio: 0.50  AORTA Ao Root diam: 3.00 cm MITRAL VALVE MV Area (PHT): 4.71 cm     SHUNTS MV Area VTI:   2.18 cm     Systemic VTI:  0.17 m MV Peak grad:  6.7 mmHg     Systemic Diam: 2.20 cm MV Mean grad:  2.0 mmHg MV Vmax:       1.29 m/s MV Vmean:  70.2 cm/s MV Decel Time: 161 msec MV E velocity: 80.60 cm/s MV A velocity: 111.00 cm/s MV E/A ratio:  0.73 Julien Nordmann MD Electronically signed by Julien Nordmann MD Signature Date/Time: 02/13/2021/6:32:03 PM    Final      Scheduled Meds:  amLODipine  5 mg Oral Daily   ascorbic acid  500 mg Oral Daily   aspirin  81 mg Oral Daily   calcium-vitamin D  1 tablet Oral BID AC   enoxaparin (LOVENOX) injection  0.5 mg/kg Subcutaneous Q24H   hydrALAZINE  25 mg Oral BID   lisinopril  20 mg Oral Daily   loratadine  10 mg Oral Daily   multivitamin with minerals  1 tablet Oral Daily   multivitamin-lutein  1 capsule Oral Daily   sodium chloride flush  3 mL Intravenous Q12H   triamterene-hydrochlorothiazide  1 tablet Oral Daily   vitamin B-12  500 mcg Oral Daily   Continuous Infusions:  sodium chloride Stopped (02/13/21 0818)   sodium chloride       LOS: 0 days     Darlin Priestly, MD [facility] If 7PM-7AM, please contact night-coverage 02/13/2021, 10:42 PM

## 2021-02-13 NOTE — Progress Notes (Signed)
Progress Note  Patient Name: Monica Harper Date of Encounter: 02/13/2021  [facility] Cardiologist:New  Subjective   Heart rate has improved, now in SR omt he 60-70s. Big confusion on what medications the patient was taking. Appears she was taking atenolol PTA. The patient denies chest pain or SOB.   Inpatient Medications    Scheduled Meds:  ascorbic acid  500 mg Oral Daily   aspirin  81 mg Oral Daily   calcium-vitamin D  1 tablet Oral BID AC   enoxaparin (LOVENOX) injection  0.5 mg/kg Subcutaneous Q24H   insulin aspart  0-9 Units Subcutaneous TID PC & HS   lisinopril  20 mg Oral Daily   loratadine  10 mg Oral Daily   multivitamin with minerals  1 tablet Oral Daily   multivitamin-lutein  1 capsule Oral Daily   triamterene-hydrochlorothiazide  1 tablet Oral Daily   vitamin B-12  500 mcg Oral Daily   Continuous Infusions:  sodium chloride 100 mL/hr at 02/12/21 1853   PRN Meds: acetaminophen **OR** acetaminophen, hydrALAZINE, magnesium hydroxide, ondansetron **OR** ondansetron (ZOFRAN) IV, polyvinyl alcohol, traZODone   Vital Signs    Vitals:   02/12/21 1800 02/12/21 1853 02/12/21 2230 02/13/21 0012  BP: (!) 175/72 (!) 179/64 (!) 155/68 (!) 160/46  Pulse: (!) 50  (!) 54 75  Resp: 17  (!) 24 17  Temp:      TempSrc:      SpO2: 97%  94% 91%  Weight:      Height:        Intake/Output Summary (Last 24 hours) at 02/13/2021 0742 Last data filed at 02/13/2021 0606 Gross per 24 hour  Intake --  Output 450 ml  Net -450 ml   Last 3 Weights 02/12/2021  Weight (lbs) 211 lb  Weight (kg) 95.709 kg      Telemetry    SR, HR 60-70s - Personally Reviewed  ECG    No new - Personally Reviewed  Physical Exam   GEN: No acute distress.   Neck: No JVD Cardiac: RRR, no murmurs, rubs, or gallops.  Respiratory: Clear to auscultation bilaterally. GI: Soft, nontender, non-distended  MS: No edema; No deformity. Neuro:  Nonfocal  Psych: Normal affect   Labs    High  Sensitivity Troponin:   Recent Labs  Lab 02/12/21 1224 02/12/21 1444  TROPONINIHS 12 10     Chemistry Recent Labs  Lab 02/12/21 1224 02/12/21 1444 02/13/21 0619  NA 137  --  138  K 4.7  --  4.1  CL 105  --  107  CO2 27  --  24  GLUCOSE 108*  --  113*  BUN 26*  --  21  CREATININE 0.64  --  0.52  CALCIUM 9.5  --  8.6*  MG  --  2.2  --   GFRNONAA >60  --  >60  ANIONGAP 5  --  7    Lipids No results for input(s): CHOL, TRIG, HDL, LABVLDL, LDLCALC, CHOLHDL in the last 168 hours.  Hematology Recent Labs  Lab 02/12/21 1224 02/13/21 0619  WBC 7.2 5.8  RBC 4.12 3.93  HGB 13.2 12.1  HCT 39.3 37.4  MCV 95.4 95.2  MCH 32.0 30.8  MCHC 33.6 32.4  RDW 15.3 15.4  PLT 210 196   Thyroid  Recent Labs  Lab 02/12/21 1444  TSH 2.080    BNP Recent Labs  Lab 02/12/21 1344  BNP 162.8*    DDimer No results for input(s): DDIMER in the  last 168 hours.   Radiology    DG Chest Port 1 View  Result Date: 02/12/2021 CLINICAL DATA:  Shortness of breath and bradycardia. EXAM: PORTABLE CHEST 1 VIEW COMPARISON:  Chest x-ray report dated March 03, 2020. FINDINGS: Normal cardiomediastinal silhouette, accentuated by technique. Normal pulmonary vascularity. Minimal scarring at the lung bases. No focal consolidation, pleural effusion, or pneumothorax. No acute osseous abnormality. IMPRESSION: 1. No active disease. Electronically Signed   By: Obie Dredge M.D.   On: 02/12/2021 13:21    Cardiac Studies   Echo ordered  Patient Profile     87 y.o. female with a hx of HTN, borderline DM2, Morbid obesity who is being seen 02/12/2021 for the evaluation of bradycardia and dizzines.  Assessment & Plan   Complete heart block-resolved - presented with dizziness and bradycardia found to be in CHB with hear rate of 42bpm.  - Apparently patient was on Atenolol PTA, although big confusion as to whether she was taking  this or not. Given improvement in heart rates suspect she was.  - 48 hour BB  washout - Labs showed relatively unremarkable - check Mag and TSH - check echo - patient needs heart monitor at discharge.    HTN - BP high - hold BB - restart home meds - consider addition of hydralazine at d/c>>further management as OP  For questions or updates, please contact [facility] Please consult [email or web address] for contact info under        Signed, Kriston Mckinnie David Stall, PA-C  02/13/2021, 7:42 AM

## 2021-02-13 NOTE — Progress Notes (Signed)
Patient resting comfortably in bed, no needs at this time

## 2021-02-13 NOTE — Progress Notes (Signed)
*  PRELIMINARY RESULTS* Echocardiogram 2D Echocardiogram has been performed.  Joanette Gula Rayla Pember 02/13/2021, 8:14 AM

## 2021-02-13 NOTE — Telephone Encounter (Signed)
[facility] is calling with urgent results.

## 2021-02-13 NOTE — Progress Notes (Signed)
Patient complaining of back discomfort. Patient assisted to a hospital bed for comfort. Purewick changed due to it becoming dislodged in the move.

## 2021-02-14 ENCOUNTER — Telehealth: Payer: Self-pay | Admitting: Cardiology

## 2021-02-14 DIAGNOSIS — R001 Bradycardia, unspecified: Secondary | ICD-10-CM

## 2021-02-14 DIAGNOSIS — J810 Acute pulmonary edema: Secondary | ICD-10-CM

## 2021-02-14 LAB — CBC
HCT: 37.5 % (ref 36.0–46.0)
Hemoglobin: 12.9 g/dL (ref 12.0–15.0)
MCH: 32 pg (ref 26.0–34.0)
MCHC: 34.4 g/dL (ref 30.0–36.0)
MCV: 93.1 fL (ref 80.0–100.0)
Platelets: 201 10*3/uL (ref 150–400)
RBC: 4.03 MIL/uL (ref 3.87–5.11)
RDW: 15.2 % (ref 11.5–15.5)
WBC: 7.4 10*3/uL (ref 4.0–10.5)
nRBC: 0 % (ref 0.0–0.2)

## 2021-02-14 LAB — BASIC METABOLIC PANEL
Anion gap: 6 (ref 5–15)
BUN: 18 mg/dL (ref 8–23)
CO2: 26 mmol/L (ref 22–32)
Calcium: 8.7 mg/dL — ABNORMAL LOW (ref 8.9–10.3)
Chloride: 106 mmol/L (ref 98–111)
Creatinine, Ser: 0.5 mg/dL (ref 0.44–1.00)
GFR, Estimated: >60 mL/min (ref >60)
Glucose, Bld: 106 mg/dL — ABNORMAL HIGH (ref 70–99)
Potassium: 4.1 mmol/L (ref 3.5–5.1)
Sodium: 138 mmol/L (ref 135–145)

## 2021-02-14 LAB — HEMOGLOBIN A1C
Hgb A1c MFr Bld: 6 % — ABNORMAL HIGH (ref 4.8–5.6)
Mean Plasma Glucose: 126 mg/dL

## 2021-02-14 LAB — MAGNESIUM: Magnesium: 2.2 mg/dL (ref 1.7–2.4)

## 2021-02-14 MED ORDER — HYDRALAZINE HCL 50 MG PO TABS
50.0000 mg | ORAL_TABLET | Freq: Three times a day (TID) | ORAL | 2 refills | Status: AC
Start: 2021-02-14 — End: 2021-05-15

## 2021-02-14 MED ORDER — FUROSEMIDE 20 MG PO TABS: 20.0000 mg | ORAL_TABLET | Freq: Every day | ORAL | 2 refills | Status: AC | PRN

## 2021-02-14 MED ORDER — FUROSEMIDE 20 MG PO TABS: 20.0000 mg | ORAL_TABLET | Freq: Every day | ORAL | 2 refills | Status: DC | PRN

## 2021-02-14 MED ORDER — HYDRALAZINE HCL 50 MG PO TABS: 50.0000 mg | ORAL_TABLET | Freq: Three times a day (TID) | ORAL | 2 refills | Status: DC

## 2021-02-14 NOTE — TOC Transition Note (Signed)
Transition of Care [facility]) - CM/SW Discharge Note   Patient Details  Name: Monica Harper MRN: [id number] Date of Birth: 11-13-1930  [facility] CM/SW Contact:  Bing Quarry, RN Phone Number: 02/14/2021, 2:07 PM   Clinical Narrative:  Patient discharged Home/Self Care. No PT/OT rec/needs. No TOC needs identified prior to imminent discharge.  Gabriel Cirri RN CM     Final next level of care: Home/Self Care Barriers to Discharge: Barriers Resolved   Patient Goals and CMS Choice        Discharge Placement                       Discharge Plan and Services                DME Arranged: N/A DME Agency: NA       HH Arranged: NA HH Agency: NA        Social Determinants of Health (SDOH) Interventions     Readmission Risk Interventions No flowsheet data found.

## 2021-02-14 NOTE — Telephone Encounter (Signed)
I rhythm called for CHB,  no date on strip.  Do we need to stop monitoring now with pacer?

## 2021-02-14 NOTE — Discharge Summary (Signed)
Physician Discharge Summary   Monica Harper  female DOB: April 14, 1930  YKZ:[id number]  PCP: [facility]  Admit date: 02/12/2021 Discharge date: 02/14/2021  Admitted From: home Disposition:  home Daughters updated at bedside prior to discharge.  CODE STATUS: Full code   Hospital Course:  For full details, please see H&P, progress notes, consult notes and ancillary notes.  Briefly,  Monica Harper is a 87 y.o. African-American female with medical history significant for borderline type 2 diabetes mellitus and hypertension, presented to the ER with concern of lightheadedness with associated bradycardia with palpitations for the last week.  Has been worsening with exertion and partially relieved at rest.  No fever or chills.    1.  Symptomatic bradycardia with complete heart block S/p leadless pacemaker placement --HR in low 40's with DOE and fatigue. --Micra AV leadless pacemaker placement today --cardiology cleared for discharge, will follow up as outpatient.  2.  Essential hypertension currently uncontrolled. --continue lisinopril, amlodipine, triamterene HCTZ --add hydralazine 50 mg TID, per cardio rec  3.  Vitamin B12 deficiency. - cont vit B12  4.  Borderline type 2 diabetes mellitus. --BG has been within goal   Discharge Diagnoses:  Principal Problem:   Sinus bradycardia Active Problems:   Complete heart block (HCC)   30 Day Unplanned Readmission Risk Score    Flowsheet Row ED to Hosp-Admission (Current) from 02/12/2021 in [facility]  30 Day Unplanned Readmission Risk Score (%) 12.53 Filed at 02/14/2021 1200       This score is the patient's risk of an unplanned readmission within 30 days of being discharged (0 -100%). The score is based on dignosis, age, lab data, medications, orders, and past utilization.   Low:  0-14.9   Medium: 15-21.9   High: 22-29.9   Extreme: 30 and above         Discharge Instructions:  Allergies as  of 02/14/2021       Reactions   Amoxicillin Nausea And Vomiting        Medication List     STOP taking these medications    atenolol 25 MG tablet Commonly known as: TENORMIN       TAKE these medications    amLODipine 5 MG tablet Commonly known as: NORVASC Take 5 mg by mouth daily.   ascorbic acid 500 MG tablet Commonly known as: VITAMIN C Take 500 mg by mouth daily.   aspirin 81 MG chewable tablet Chew 81 mg by mouth daily.   Calcium Carb-Cholecalciferol 600-10 MG-MCG Tabs Take 1 tablet by mouth 2 (two) times daily before a meal.   carboxymethylcellulose 1 % ophthalmic solution Apply 1 drop to eye daily as needed.   Cinnamon Bark Powd 1 packet by Does not apply route daily.   fexofenadine 180 MG tablet Commonly known as: ALLEGRA Take 180 mg by mouth daily.   furosemide 20 MG tablet Commonly known as: Lasix Take 1 tablet (20 mg total) by mouth daily as needed (for shortness of breath and leg swelling).   hydrALAZINE 50 MG tablet Commonly known as: APRESOLINE Take 1 tablet (50 mg total) by mouth 3 (three) times daily.   lisinopril 20 MG tablet Commonly known as: ZESTRIL Take 20 mg by mouth daily.   Multi-Vitamin tablet Take 1 tablet by mouth daily.   multivitamin-lutein Caps capsule Take 1 capsule by mouth daily.   triamterene-hydrochlorothiazide 37.5-25 MG capsule Commonly known as: DYAZIDE Take 1 capsule by mouth daily.   vitamin B-12 500 MCG  tablet Commonly known as: CYANOCOBALAMIN Take 500 mcg by mouth daily.         Follow-up Information     [facility] Follow up in 1 week(s).   Contact information: [address] [place] [address] [phone]                 Allergies  Allergen Reactions   Amoxicillin Nausea And Vomiting     The results of significant diagnostics from this hospitalization (including imaging, microbiology, ancillary and laboratory) are listed below for reference.    Consultations:   Procedures/Studies: EP PPM/ICD IMPLANT  Result Date: 02/13/2021 Successful implantation Micra AV leadless pacemaker   DG Chest Port 1 View  Result Date: 02/12/2021 CLINICAL DATA:  Shortness of breath and bradycardia. EXAM: PORTABLE CHEST 1 VIEW COMPARISON:  Chest x-ray report dated March 03, 2020. FINDINGS: Normal cardiomediastinal silhouette, accentuated by technique. Normal pulmonary vascularity. Minimal scarring at the lung bases. No focal consolidation, pleural effusion, or pneumothorax. No acute osseous abnormality. IMPRESSION: 1. No active disease. Electronically Signed   By: Obie Dredge M.D.   On: 02/12/2021 13:21   ECHOCARDIOGRAM COMPLETE  Result Date: 02/13/2021    ECHOCARDIOGRAM REPORT   Patient Name:   Monica Harper Date of Exam: 02/13/2021 Medical Rec #:  [id number]   Height:       62.0 in Accession #:    [id number]  Weight:       211.0 lb Date of Birth:  1931-03-06  BSA:          1.956 m Patient Age:    89 years    BP:           160/46 mmHg Patient Gender: F           HR:           61 bpm. Exam Location:  [facility] Procedure: 2D Echo, Color Doppler and Cardiac Doppler Indications:     I44.2 Heart Block, complete  History:         Patient has no prior history of Echocardiogram examinations.                  Risk Factors:Hypertension.  Sonographer:     Humphrey Rolls Referring Phys:  [id number] CADENCE H FURTH Diagnosing Phys: Julien Nordmann MD IMPRESSIONS  1. Left ventricular ejection fraction, by estimation, is 50 to 55%. The left ventricle has low normal function. The left ventricle demonstrates global hypokinesis. There is mild left ventricular hypertrophy. Left ventricular diastolic parameters are indeterminate.  2. Right ventricular systolic function is normal. The right ventricular size is normal. Tricuspid regurgitation signal is inadequate for assessing PA pressure.  3. The mitral valve is normal in structure. No evidence of mitral valve regurgitation. No evidence of  mitral stenosis.  4. The aortic valve is normal in structure. Aortic valve regurgitation is not visualized. Aortic valve sclerosis is present, with no evidence of aortic valve stenosis.  5. The inferior vena cava is normal in size with greater than 50% respiratory variability, suggesting right atrial pressure of 3 mmHg. FINDINGS  Left Ventricle: Left ventricular ejection fraction, by estimation, is 50 to 55%. The left ventricle has low normal function. The left ventricle demonstrates global hypokinesis. The left ventricular internal cavity size was normal in size. There is mild left ventricular hypertrophy. Left ventricular diastolic parameters are indeterminate. Right Ventricle: The right ventricular size is normal. No increase in right ventricular wall thickness. Right ventricular systolic function is normal. Tricuspid regurgitation signal  is inadequate for assessing PA pressure. Left Atrium: Left atrial size was normal in size. Right Atrium: Right atrial size was normal in size. Pericardium: There is no evidence of pericardial effusion. Mitral Valve: The mitral valve is normal in structure. No evidence of mitral valve regurgitation. No evidence of mitral valve stenosis. MV peak gradient, 6.7 mmHg. The mean mitral valve gradient is 2.0 mmHg. Tricuspid Valve: The tricuspid valve is normal in structure. Tricuspid valve regurgitation is not demonstrated. No evidence of tricuspid stenosis. Aortic Valve: The aortic valve is normal in structure. Aortic valve regurgitation is not visualized. Aortic valve sclerosis is present, with no evidence of aortic valve stenosis. Aortic valve mean gradient measures 5.0 mmHg. Aortic valve peak gradient measures 8.8 mmHg. Aortic valve area, by VTI measures 1.92 cm. Pulmonic Valve: The pulmonic valve was normal in structure. Pulmonic valve regurgitation is not visualized. No evidence of pulmonic stenosis. Aorta: The aortic root is normal in size and structure. Venous: The inferior  vena cava is normal in size with greater than 50% respiratory variability, suggesting right atrial pressure of 3 mmHg. IAS/Shunts: No atrial level shunt detected by color flow Doppler.  LEFT VENTRICLE PLAX 2D LVIDd:         4.10 cm   Diastology LVIDs:         2.90 cm   LV e' medial:    7.94 cm/s LV PW:         1.10 cm   LV E/e' medial:  10.2 LV IVS:        1.10 cm   LV e' lateral:   5.33 cm/s LVOT diam:     2.20 cm   LV E/e' lateral: 15.1 LV SV:         65 LV SV Index:   33 LVOT Area:     3.80 cm  RIGHT VENTRICLE RV Basal diam:  3.20 cm LEFT ATRIUM             Index        RIGHT ATRIUM           Index LA diam:        3.60 cm 1.84 cm/m   RA Area:     13.30 cm LA Vol (A2C):   54.0 ml 27.61 ml/m  RA Volume:   31.60 ml  16.16 ml/m LA Vol (A4C):   53.3 ml 27.25 ml/m LA Biplane Vol: 53.6 ml 27.41 ml/m  AORTIC VALVE                     PULMONIC VALVE AV Area (Vmax):    2.11 cm      PV Vmax:       0.63 m/s AV Area (Vmean):   1.97 cm      PV Vmean:      41.100 cm/s AV Area (VTI):     1.92 cm      PV VTI:        0.118 m AV Vmax:           148.00 cm/s   PV Peak grad:  1.6 mmHg AV Vmean:          107.000 cm/s  PV Mean grad:  1.0 mmHg AV VTI:            0.341 m AV Peak Grad:      8.8 mmHg AV Mean Grad:      5.0 mmHg LVOT Vmax:         82.20  cm/s LVOT Vmean:        55.400 cm/s LVOT VTI:          0.172 m LVOT/AV VTI ratio: 0.50  AORTA Ao Root diam: 3.00 cm MITRAL VALVE MV Area (PHT): 4.71 cm     SHUNTS MV Area VTI:   2.18 cm     Systemic VTI:  0.17 m MV Peak grad:  6.7 mmHg     Systemic Diam: 2.20 cm MV Mean grad:  2.0 mmHg MV Vmax:       1.29 m/s MV Vmean:      70.2 cm/s MV Decel Time: 161 msec MV E velocity: 80.60 cm/s MV A velocity: 111.00 cm/s MV E/A ratio:  0.73 Julien Nordmann MD Electronically signed by Julien Nordmann MD Signature Date/Time: 02/13/2021/6:32:03 PM    Final       Labs: BNP (last 3 results) Recent Labs    02/12/21 1344  BNP 162.8*   Basic Metabolic Panel: Recent Labs  Lab 02/12/21 1224  02/12/21 1444 02/13/21 0619 02/14/21 0533  NA 137  --  138 138  K 4.7  --  4.1 4.1  CL 105  --  107 106  CO2 27  --  24 26  GLUCOSE 108*  --  113* 106*  BUN 26*  --  21 18  CREATININE 0.64  --  0.52 0.50  CALCIUM 9.5  --  8.6* 8.7*  MG  --  2.2  --  2.2   Liver Function Tests: No results for input(s): AST, ALT, ALKPHOS, BILITOT, PROT, ALBUMIN in the last 168 hours. No results for input(s): LIPASE, AMYLASE in the last 168 hours. No results for input(s): AMMONIA in the last 168 hours. CBC: Recent Labs  Lab 02/12/21 1224 02/13/21 0619 02/14/21 0533  WBC 7.2 5.8 7.4  HGB 13.2 12.1 12.9  HCT 39.3 37.4 37.5  MCV 95.4 95.2 93.1  PLT 210 196 201   Cardiac Enzymes: No results for input(s): CKTOTAL, CKMB, CKMBINDEX, TROPONINI in the last 168 hours. BNP: Invalid input(s): POCBNP CBG: Recent Labs  Lab 02/12/21 2228 02/13/21 0950 02/13/21 1128 02/13/21 1419 02/13/21 1711  GLUCAP 119* 146* 95 96 89   D-Dimer No results for input(s): DDIMER in the last 72 hours. Hgb A1c Recent Labs    02/13/21 0619  HGBA1C 6.0*   Lipid Profile No results for input(s): CHOL, HDL, LDLCALC, TRIG, CHOLHDL, LDLDIRECT in the last 72 hours. Thyroid function studies Recent Labs    02/12/21 1444  TSH 2.080   Anemia work up No results for input(s): VITAMINB12, FOLATE, FERRITIN, TIBC, IRON, RETICCTPCT in the last 72 hours. Urinalysis No results found for: COLORURINE, APPEARANCEUR, LABSPEC, PHURINE, GLUCOSEU, HGBUR, BILIRUBINUR, KETONESUR, PROTEINUR, UROBILINOGEN, NITRITE, LEUKOCYTESUR Sepsis Labs Invalid input(s): PROCALCITONIN,  WBC,  LACTICIDVEN Microbiology Recent Results (from the past 240 hour(s))  Resp Panel by RT-PCR (Flu A&B, Covid) Nasopharyngeal Swab     Status: None   Collection Time: 02/12/21  1:44 PM   Specimen: Nasopharyngeal Swab; Nasopharyngeal(NP) swabs in vial transport medium  Result Value Ref Range Status   SARS Coronavirus 2 by RT PCR NEGATIVE NEGATIVE Final     Comment: (NOTE) SARS-CoV-2 target nucleic acids are NOT DETECTED.  The SARS-CoV-2 RNA is generally detectable in upper respiratory specimens during the acute phase of infection. The lowest concentration of SARS-CoV-2 viral copies this assay can detect is 138 copies/mL. A negative result does not preclude SARS-Cov-2 infection and should not be used as the sole basis for treatment or  other patient management decisions. A negative result may occur with  improper specimen collection/handling, submission of specimen other than nasopharyngeal swab, presence of viral mutation(s) within the areas targeted by this assay, and inadequate number of viral copies(<138 copies/mL). A negative result must be combined with clinical observations, patient history, and epidemiological information. The expected result is Negative.  Fact Sheet for Patients:  BloggerCourse.com  Fact Sheet for Healthcare Providers:  SeriousBroker.it  This test is no t yet approved or cleared by the [place] FDA and  has been authorized for detection and/or diagnosis of SARS-CoV-2 by FDA under an Emergency Use Authorization (EUA). This EUA will remain  in effect (meaning this test can be used) for the duration of the COVID-19 declaration under Section 564(b)(1) of the Act, 21 U.S.C.section 360bbb-3(b)(1), unless the authorization is terminated  or revoked sooner.       Influenza A by PCR NEGATIVE NEGATIVE Final   Influenza B by PCR NEGATIVE NEGATIVE Final    Comment: (NOTE) The Xpert Xpress SARS-CoV-2/FLU/RSV plus assay is intended as an aid in the diagnosis of influenza from Nasopharyngeal swab specimens and should not be used as a sole basis for treatment. Nasal washings and aspirates are unacceptable for Xpert Xpress SARS-CoV-2/FLU/RSV testing.  Fact Sheet for Patients: BloggerCourse.com  Fact Sheet for Healthcare  Providers: SeriousBroker.it  This test is not yet approved or cleared by the [place] FDA and has been authorized for detection and/or diagnosis of SARS-CoV-2 by FDA under an Emergency Use Authorization (EUA). This EUA will remain in effect (meaning this test can be used) for the duration of the COVID-19 declaration under Section 564(b)(1) of the Act, 21 U.S.C. section 360bbb-3(b)(1), unless the authorization is terminated or revoked.  Performed at [facility], [address] [place], [place] [address]      Total time spend on discharging this patient, including the last patient exam, discussing the hospital stay, instructions for ongoing care as it relates to all pertinent caregivers, as well as preparing the medical discharge records, prescriptions, and/or referrals as applicable, is 35 minutes.    Darlin Priestly, MD  [facility] 02/14/2021, 1:11 PM

## 2021-02-14 NOTE — Progress Notes (Signed)
Patient and daughter reports that the patient's hearing aids are missing. Daughter verifies that staff never took possession of the hearing aids. Her brother, the patient's son placed the hearing aids in the basket attached to the patient's walker. ED staff did look for the hearing aids but was not able to find them. Daughter states that her brother took the walker to the car so they are unsure where they lost the hearing aids.

## 2021-02-14 NOTE — Progress Notes (Signed)
Progress Note  Patient Name: Monica Harper Date of Encounter: 02/14/2021  [facility] Cardiologist: [facility] cardiology -Rihanna Marseille  Subjective  Feels well, slept well, no complaints Would like to go home today, Legs feel little bit weak but she ambulated with a walker in the hallway  Inpatient Medications    Scheduled Meds:  amLODipine  5 mg Oral Daily   ascorbic acid  500 mg Oral Daily   aspirin  81 mg Oral Daily   calcium-vitamin D  1 tablet Oral BID AC   enoxaparin (LOVENOX) injection  0.5 mg/kg Subcutaneous Q24H   hydrALAZINE  25 mg Oral BID   lisinopril  20 mg Oral Daily   loratadine  10 mg Oral Daily   multivitamin with minerals  1 tablet Oral Daily   multivitamin-lutein  1 capsule Oral Daily   sodium chloride flush  3 mL Intravenous Q12H   triamterene-hydrochlorothiazide  1 tablet Oral Daily   vitamin B-12  500 mcg Oral Daily   Continuous Infusions:  sodium chloride Stopped (02/13/21 0818)   sodium chloride     PRN Meds: sodium chloride, acetaminophen **OR** acetaminophen, acetaminophen, hydrALAZINE, magnesium hydroxide, ondansetron **OR** ondansetron (ZOFRAN) IV, ondansetron (ZOFRAN) IV, polyvinyl alcohol, sodium chloride flush, traZODone   Vital Signs    Vitals:   02/13/21 2359 02/14/21 0608 02/14/21 0836 02/14/21 1126  BP: (!) 157/86 (!) 142/59 (!) 156/63 (!) 157/89  Pulse: 79 70 86 66  Resp: 17 18 18 18   Temp: 98 F (36.7 C) 98.4 F (36.9 C) 98.6 F (37 C) 98.4 F (36.9 C)  TempSrc:   Oral Oral  SpO2: 95% 94% 94% 97%  Weight:      Height:        Intake/Output Summary (Last 24 hours) at 02/14/2021 1306 Last data filed at 02/14/2021 1023 Gross per 24 hour  Intake 733.33 ml  Output 2350 ml  Net -1616.67 ml   Last 3 Weights 02/13/2021 02/12/2021  Weight (lbs) 211 lb 211 lb  Weight (kg) 95.709 kg 95.709 kg      Telemetry    NSR/paced - Personally Reviewed  ECG     - Personally Reviewed  Physical Exam   GEN: No acute distress.   Neck: No  JVD Cardiac: RRR, no murmurs, rubs, or gallops.  Respiratory: Clear to auscultation bilaterally. GI: Soft, nontender, non-distended  MS: No edema; No deformity. Neuro:  Nonfocal  Psych: Normal affect   Labs    High Sensitivity Troponin:   Recent Labs  Lab 02/12/21 1224 02/12/21 1444  TROPONINIHS 12 10     Chemistry Recent Labs  Lab 02/12/21 1224 02/12/21 1444 02/13/21 0619 02/14/21 0533  NA 137  --  138 138  K 4.7  --  4.1 4.1  CL 105  --  107 106  CO2 27  --  24 26  GLUCOSE 108*  --  113* 106*  BUN 26*  --  21 18  CREATININE 0.64  --  0.52 0.50  CALCIUM 9.5  --  8.6* 8.7*  MG  --  2.2  --  2.2  GFRNONAA >60  --  >60 >60  ANIONGAP 5  --  7 6    Lipids No results for input(s): CHOL, TRIG, HDL, LABVLDL, LDLCALC, CHOLHDL in the last 168 hours.  Hematology Recent Labs  Lab 02/12/21 1224 02/13/21 0619 02/14/21 0533  WBC 7.2 5.8 7.4  RBC 4.12 3.93 4.03  HGB 13.2 12.1 12.9  HCT 39.3 37.4 37.5  MCV 95.4 95.2  93.1  MCH 32.0 30.8 32.0  MCHC 33.6 32.4 34.4  RDW 15.3 15.4 15.2  PLT 210 196 201   Thyroid  Recent Labs  Lab 02/12/21 1444  TSH 2.080    BNP Recent Labs  Lab 02/12/21 1344  BNP 162.8*    DDimer No results for input(s): DDIMER in the last 168 hours.   Radiology    EP PPM/ICD IMPLANT  Result Date: 02/13/2021 Successful implantation Micra AV leadless pacemaker   ECHOCARDIOGRAM COMPLETE  Result Date: 02/13/2021    ECHOCARDIOGRAM REPORT   Patient Name:   Monica Harper Date of Exam: 02/13/2021 Medical Rec #:  [id number]   Height:       62.0 in Accession #:    [id number]  Weight:       211.0 lb Date of Birth:  21-Dec-1930  BSA:          1.956 m Patient Age:    87 years    BP:           160/46 mmHg Patient Gender: F           HR:           61 bpm. Exam Location:  [facility] Procedure: 2D Echo, Color Doppler and Cardiac Doppler Indications:     I44.2 Heart Block, complete  History:         Patient has no prior history of Echocardiogram examinations.                   Risk Factors:Hypertension.  Sonographer:     Humphrey Rolls Referring Phys:  [id number] CADENCE H FURTH Diagnosing Phys: Julien Nordmann MD IMPRESSIONS  1. Left ventricular ejection fraction, by estimation, is 50 to 55%. The left ventricle has low normal function. The left ventricle demonstrates global hypokinesis. There is mild left ventricular hypertrophy. Left ventricular diastolic parameters are indeterminate.  2. Right ventricular systolic function is normal. The right ventricular size is normal. Tricuspid regurgitation signal is inadequate for assessing PA pressure.  3. The mitral valve is normal in structure. No evidence of mitral valve regurgitation. No evidence of mitral stenosis.  4. The aortic valve is normal in structure. Aortic valve regurgitation is not visualized. Aortic valve sclerosis is present, with no evidence of aortic valve stenosis.  5. The inferior vena cava is normal in size with greater than 50% respiratory variability, suggesting right atrial pressure of 3 mmHg. FINDINGS  Left Ventricle: Left ventricular ejection fraction, by estimation, is 50 to 55%. The left ventricle has low normal function. The left ventricle demonstrates global hypokinesis. The left ventricular internal cavity size was normal in size. There is mild left ventricular hypertrophy. Left ventricular diastolic parameters are indeterminate. Right Ventricle: The right ventricular size is normal. No increase in right ventricular wall thickness. Right ventricular systolic function is normal. Tricuspid regurgitation signal is inadequate for assessing PA pressure. Left Atrium: Left atrial size was normal in size. Right Atrium: Right atrial size was normal in size. Pericardium: There is no evidence of pericardial effusion. Mitral Valve: The mitral valve is normal in structure. No evidence of mitral valve regurgitation. No evidence of mitral valve stenosis. MV peak gradient, 6.7 mmHg. The mean mitral valve gradient is 2.0 mmHg.  Tricuspid Valve: The tricuspid valve is normal in structure. Tricuspid valve regurgitation is not demonstrated. No evidence of tricuspid stenosis. Aortic Valve: The aortic valve is normal in structure. Aortic valve regurgitation is not visualized. Aortic valve sclerosis is present, with no evidence of aortic valve  stenosis. Aortic valve mean gradient measures 5.0 mmHg. Aortic valve peak gradient measures 8.8 mmHg. Aortic valve area, by VTI measures 1.92 cm. Pulmonic Valve: The pulmonic valve was normal in structure. Pulmonic valve regurgitation is not visualized. No evidence of pulmonic stenosis. Aorta: The aortic root is normal in size and structure. Venous: The inferior vena cava is normal in size with greater than 50% respiratory variability, suggesting right atrial pressure of 3 mmHg. IAS/Shunts: No atrial level shunt detected by color flow Doppler.  LEFT VENTRICLE PLAX 2D LVIDd:         4.10 cm   Diastology LVIDs:         2.90 cm   LV e' medial:    7.94 cm/s LV PW:         1.10 cm   LV E/e' medial:  10.2 LV IVS:        1.10 cm   LV e' lateral:   5.33 cm/s LVOT diam:     2.20 cm   LV E/e' lateral: 15.1 LV SV:         65 LV SV Index:   33 LVOT Area:     3.80 cm  RIGHT VENTRICLE RV Basal diam:  3.20 cm LEFT ATRIUM             Index        RIGHT ATRIUM           Index LA diam:        3.60 cm 1.84 cm/m   RA Area:     13.30 cm LA Vol (A2C):   54.0 ml 27.61 ml/m  RA Volume:   31.60 ml  16.16 ml/m LA Vol (A4C):   53.3 ml 27.25 ml/m LA Biplane Vol: 53.6 ml 27.41 ml/m  AORTIC VALVE                     PULMONIC VALVE AV Area (Vmax):    2.11 cm      PV Vmax:       0.63 m/s AV Area (Vmean):   1.97 cm      PV Vmean:      41.100 cm/s AV Area (VTI):     1.92 cm      PV VTI:        0.118 m AV Vmax:           148.00 cm/s   PV Peak grad:  1.6 mmHg AV Vmean:          107.000 cm/s  PV Mean grad:  1.0 mmHg AV VTI:            0.341 m AV Peak Grad:      8.8 mmHg AV Mean Grad:      5.0 mmHg LVOT Vmax:         82.20 cm/s LVOT  Vmean:        55.400 cm/s LVOT VTI:          0.172 m LVOT/AV VTI ratio: 0.50  AORTA Ao Root diam: 3.00 cm MITRAL VALVE MV Area (PHT): 4.71 cm     SHUNTS MV Area VTI:   2.18 cm     Systemic VTI:  0.17 m MV Peak grad:  6.7 mmHg     Systemic Diam: 2.20 cm MV Mean grad:  2.0 mmHg MV Vmax:       1.29 m/s MV Vmean:      70.2 cm/s MV Decel Time: 161 msec MV E velocity: 80.60  cm/s MV A velocity: 111.00 cm/s MV E/A ratio:  0.73 Julien Nordmann MD Electronically signed by Julien Nordmann MD Signature Date/Time: 02/13/2021/6:32:03 PM    Final     Cardiac Studies   Echocardiogram . Left ventricular ejection fraction, by estimation, is 50 to 55%. The  left ventricle has low normal function. The left ventricle demonstrates  global hypokinesis. There is mild left ventricular hypertrophy. Left  ventricular diastolic parameters are  indeterminate.   2. Right ventricular systolic function is normal. The right ventricular  size is normal. Tricuspid regurgitation signal is inadequate for assessing  PA pressure.   3. The mitral valve is normal in structure. No evidence of mitral valve  regurgitation. No evidence of mitral stenosis.   4. The aortic valve is normal in structure. Aortic valve regurgitation is  not visualized. Aortic valve sclerosis is present, with no evidence of  aortic valve stenosis.   5. The inferior vena cava is normal in size with greater than 50%  respiratory variability, suggesting right atrial pressure of 3 mmHg.    Patient Profile     87 y.o. female with history of poorly controlled hypertension presenting with shortness of breath, leg swelling, complete heart block  Assessment & Plan    Complete heart block Given persistent heart block on telemetry, taken for leadless pacemaker yesterday Tolerated procedure well, no complications, --- I remove the stitch in the right groin, bandage placed, discharge instructions provided Telemetry reviewed, maintaining normal sinus rhythm,  intermittently paced --Follow-up instructions arranged  Essential hypertension Blood pressure continues to run high, recommend we add hydralazine 50 3 times daily, continue other outpatient medications Recommend she closely monitor blood pressure at home and call our office with numbers Continue lisinopril, amlodipine, triamterene HCTZ  Long discussion concerning discharge instructions, Stitch removed from right groin, bandage placed,  Total encounter time more than 35 minutes  Greater than 50% was spent in counseling and coordination of care with the patient    For questions or updates, please contact [facility] Please consult [email or web address] for contact info under        Signed, Julien Nordmann, MD  02/14/2021, 1:06 PM

## 2021-02-14 NOTE — Progress Notes (Signed)
Discharge instructions explained to pt and daughter/both verbalized understanding. IV and tele removed. Will transport off unit via wheelchair.  

## 2021-02-16 ENCOUNTER — Encounter: Payer: Self-pay | Admitting: Cardiology

## 2021-02-17 DIAGNOSIS — Z950 Presence of cardiac pacemaker: Secondary | ICD-10-CM | POA: Insufficient documentation

## 2021-02-25 ENCOUNTER — Telehealth: Payer: Self-pay | Admitting: Cardiovascular Disease

## 2021-02-25 NOTE — Telephone Encounter (Signed)
Patient declined [facility] fu for now.  She is scheduled with [facility] and will fu with him.   Boneta Lucks - Please ignore previous msg.

## 2021-02-25 NOTE — Telephone Encounter (Signed)
-----   Message from Antonieta Iba, MD sent at 02/14/2021  1:03 PM EST ----- Leaving the hospital December 10 New pacer in place Needs new patient appointment with Dr. Lalla Brothers in [facility] Also general cardiology visit with app or gollan Thx TG

## 2021-07-08 ENCOUNTER — Ambulatory Visit (INDEPENDENT_AMBULATORY_CARE_PROVIDER_SITE_OTHER): Payer: Medicare Other | Admitting: Podiatry

## 2021-07-08 ENCOUNTER — Encounter: Payer: Self-pay | Admitting: Podiatry

## 2021-07-08 DIAGNOSIS — M2142 Flat foot [pes planus] (acquired), left foot: Secondary | ICD-10-CM

## 2021-07-08 DIAGNOSIS — M2141 Flat foot [pes planus] (acquired), right foot: Secondary | ICD-10-CM | POA: Diagnosis not present

## 2021-07-08 DIAGNOSIS — M76821 Posterior tibial tendinitis, right leg: Secondary | ICD-10-CM | POA: Diagnosis not present

## 2021-07-08 DIAGNOSIS — E119 Type 2 diabetes mellitus without complications: Secondary | ICD-10-CM

## 2021-07-08 NOTE — Progress Notes (Addendum)
?  Subjective:  ?Patient ID: Monica Harper, female    DOB: 1930/03/13,  MRN: [id number] ? ?Chief Complaint  ?Patient presents with  ? Foot Pain  ? Diabetes  ?  Diabetic foot exam, A1C  6.0  ? ? ?88 y.o. female presents with the above complaint. History confirmed with patient.  She has pain along the inside of the ankle and extending into the arch is been worsening over the last few weeks no known injury. ? ?Objective:  ?Physical Exam: ?warm, good capillary refill, no trophic changes or ulcerative lesions, normal DP and PT pulses, normal sensory exam, and right foot pain along the PT tendon along its malleolar course and into the insertion. Bilaterally she has pes planus deformity  ? ? ?Assessment:  ? ?1. Posterior tibial tendon dysfunction (PTTD) of right lower extremity   ?2. Posterior tibial tendinitis of right lower extremity   ?3. Pes planus of both feet   ?4. Type 2 diabetes, diet controlled (HCC)   ? ? ? ?Plan:  ?Patient was evaluated and treated and all questions answered. ? ?Patient educated on diabetes. Discussed proper diabetic foot care and discussed risks and complications of disease. Educated patient in depth on reasons to return to the office immediately should he/she discover anything concerning or new on the feet. All questions answered. Discussed proper shoes as well.  ? ?Discussed the etiology and treatment options for Achilles tendinitis including stretching, formal physical therapy with an eccentric exercises therapy plan, supportive shoegears such as a running shoe or sneaker, heel lifts, topical and oral medications.  We also discussed that I do not routinely perform injections in this area because of the risk of an increased damage or rupture of the tendon.  We also discussed the role of surgical treatment of this for patients who do not improve after exhausting non-surgical treatment options. ? ?-XR reviewed with patient ?-Educated on stretching and icing of the affected limb. ?-Referral placed  to physical therapy. ?-Advised use Voltaren 3-4 times daily.  Use this in addition to Tylenol for pain control ?-Tri-Lock ankle brace dispensed for support ? ?No follow-ups on file.  ? ?

## 2021-07-20 ENCOUNTER — Telehealth: Payer: Self-pay | Admitting: Podiatry

## 2021-07-20 NOTE — Telephone Encounter (Signed)
Order for shoes placed in last OV encounter ?

## 2021-07-20 NOTE — Addendum Note (Signed)
Addended byLilian Kapur, Blayton Huttner R on: 07/20/2021 10:18 AM ? ? Modules accepted: Orders ? ?

## 2021-07-20 NOTE — Telephone Encounter (Signed)
Patient called LM on after hours VM asking for Dr. Lilian Kapur to call her. I called the patient to see what she needed and the patient states she wanted Dr. Lilian Kapur to call [facility] for a pair of Diabetic shoes. I explained to pt that we do shoes here and that I would be more than happy to schedule her with Arlys John to start the process. Pt scheduled 07/27/2021 with Arlys John for fitting of shoes. ?

## 2021-07-27 ENCOUNTER — Ambulatory Visit: Payer: Medicare Other

## 2021-07-27 DIAGNOSIS — E119 Type 2 diabetes mellitus without complications: Secondary | ICD-10-CM

## 2021-07-27 DIAGNOSIS — M2141 Flat foot [pes planus] (acquired), right foot: Secondary | ICD-10-CM

## 2021-07-27 NOTE — Progress Notes (Signed)
SITUATION Reason for Consult: Evaluation for Prefabricated Diabetic Shoes and Custom Diabetic Inserts. Patient / Caregiver Report: Patient would like well fitting shoes  OBJECTIVE DATA: Patient History / Diagnosis:    ICD-10-CM   1. Type 2 diabetes, diet controlled ([place])  E11.9     2. Pes planus of both feet  M21.41    M21.42       Physician Treating Diabetes:  Dion Body, MD  Current or Previous Devices:   Historical user  In-Person Foot Examination: Ulcers & Callousing:   None Deformities:    Pes planus Sensation:    Intact  Shoe Size:     8.5W  ORTHOTIC RECOMMENDATION Recommended Devices: - 1x pair prefabricated PDAC approved diabetic shoes; Patient Selected Apex V952W white velcro sneaker3  Size 8.5XW for bunion - 3x pair custom-to-patient PDAC approved vacuum formed diabetic insoles.  GOALS OF SHOES AND INSOLES - Reduce shear and pressure - Reduce / Prevent callus formation - Reduce / Prevent ulceration - Protect the fragile healing compromised diabetic foot.  Patient would benefit from diabetic shoes and inserts as patient has diabetes mellitus and the patient has one or more of the following conditions: - History of partial or complete amputation of the foot - History of previous foot ulceration. - History of pre-ulcerative callus - Peripheral neuropathy with evidence of callus formation - Foot deformity - Poor circulation  ACTIONS PERFORMED Potential out of pocket cost was communicated to patient. Patient understood and consented to measurement and casting. Patient was casted for insoles via crush box and measured for shoes via brannock device. Procedure was explained and patient tolerated procedure well. All questions were answered and concerns addressed. Casts were shipped to central fabrication for HOLD until Certificate of Medical Necessity or otherwise necessary authorization from insurance is obtained.  PLAN Shoes are to be ordered and casts  released from hold once all appropriate paperwork is complete. Patient is to be contacted and scheduled for fitting once shoes and insoles have been fabricated and received.

## 2021-08-12 ENCOUNTER — Telehealth: Payer: Self-pay

## 2021-08-12 NOTE — Telephone Encounter (Signed)
CMN Received - Shoes ordered and casts released from fabrication hold.  Apex V792W 8.5XW

## 2021-08-31 ENCOUNTER — Encounter: Payer: Self-pay | Admitting: Podiatry

## 2021-08-31 ENCOUNTER — Ambulatory Visit (INDEPENDENT_AMBULATORY_CARE_PROVIDER_SITE_OTHER): Payer: Medicare Other | Admitting: Podiatry

## 2021-08-31 DIAGNOSIS — M76821 Posterior tibial tendinitis, right leg: Secondary | ICD-10-CM

## 2021-09-02 ENCOUNTER — Ambulatory Visit: Payer: Medicare Other | Admitting: Podiatry

## 2021-09-18 ENCOUNTER — Ambulatory Visit (INDEPENDENT_AMBULATORY_CARE_PROVIDER_SITE_OTHER): Payer: Medicare Other | Admitting: Podiatry

## 2021-09-18 DIAGNOSIS — M2142 Flat foot [pes planus] (acquired), left foot: Secondary | ICD-10-CM | POA: Diagnosis not present

## 2021-09-18 DIAGNOSIS — E119 Type 2 diabetes mellitus without complications: Secondary | ICD-10-CM

## 2021-09-18 DIAGNOSIS — M2141 Flat foot [pes planus] (acquired), right foot: Secondary | ICD-10-CM

## 2021-09-18 NOTE — Progress Notes (Signed)
Patient presents to pick up diabetic shoes and inserts.  Wearing instructions were given.  Patient stated she understood all instructions.  She was also given the Medicare Suppliers guidelines.  She was advised to call us if she has any questions or concerns.

## 2022-04-21 IMAGING — DX DG CHEST 1V PORT
1 series · 1 of 1 positions shown · non-contrast
Comparison: Chest x-ray report dated March 03, 2020.

CLINICAL DATA: Shortness of breath and bradycardia.

EXAM:
PORTABLE CHEST 1 VIEW

[chest ap]
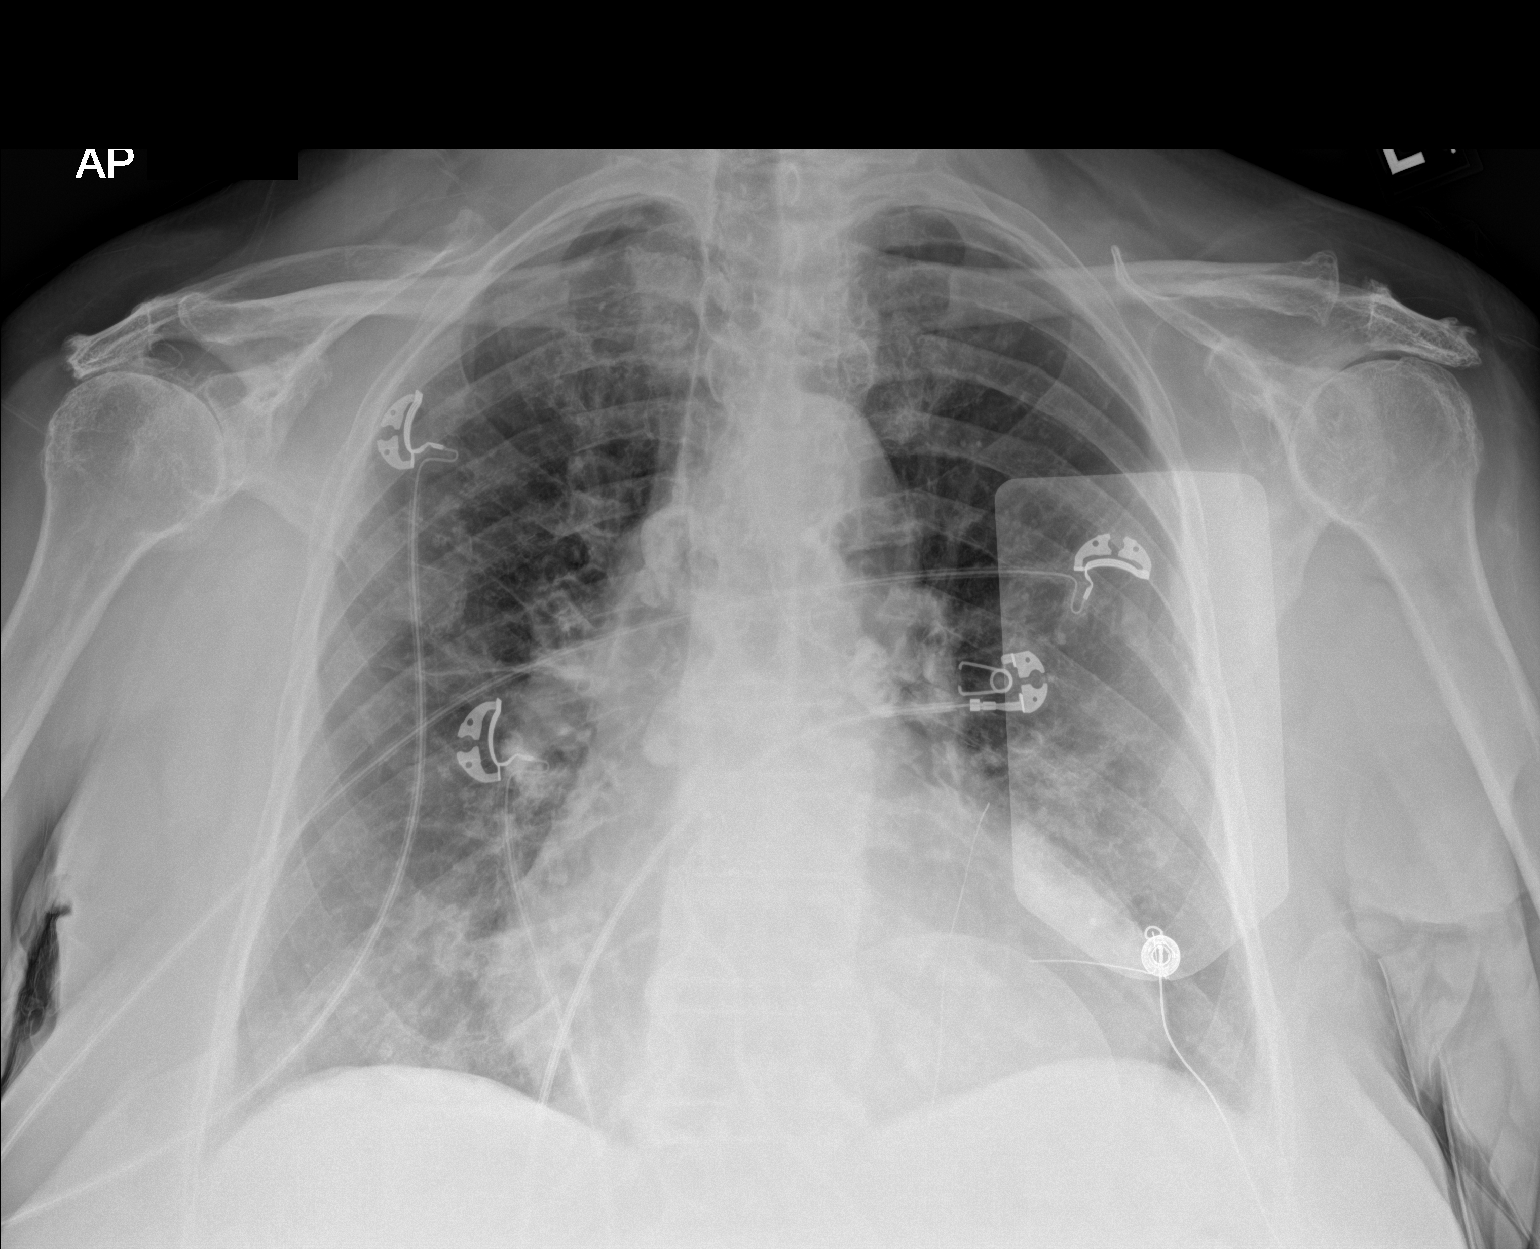

[1 of 1 positions shown; findings below may reference images not displayed]

FINDINGS: Normal cardiomediastinal silhouette, accentuated by technique.
Normal pulmonary vascularity. Minimal scarring at the lung bases. No
focal consolidation, pleural effusion, or pneumothorax. No acute
osseous abnormality.
IMPRESSION: 1. No active disease.
